# Patient Record
Sex: Male | Born: 1970 | Race: White | Hispanic: No | Marital: Married | Smoking: Current every day smoker
Health system: Southern US, Community
[De-identification: ages and names within clinical notes are randomized; demographics above are authoritative.]

## PROBLEM LIST (undated history)

## (undated) DIAGNOSIS — I499 Cardiac arrhythmia, unspecified: Secondary | ICD-10-CM

## (undated) DIAGNOSIS — R55 Syncope and collapse: Principal | ICD-10-CM

## (undated) HISTORY — PX: ELBOW SURGERY: SHX618

---

## 2004-02-06 ENCOUNTER — Ambulatory Visit (HOSPITAL_COMMUNITY): Admission: RE | Admit: 2004-02-06 | Discharge: 2004-02-06 | Payer: Self-pay | Admitting: Family Medicine

## 2004-09-21 ENCOUNTER — Emergency Department (HOSPITAL_COMMUNITY): Admission: EM | Admit: 2004-09-21 | Discharge: 2004-09-21 | Payer: Self-pay | Admitting: Emergency Medicine

## 2007-06-11 ENCOUNTER — Emergency Department (HOSPITAL_COMMUNITY): Admission: EM | Admit: 2007-06-11 | Discharge: 2007-06-11 | Payer: Self-pay | Admitting: *Deleted

## 2007-06-25 ENCOUNTER — Ambulatory Visit: Admission: RE | Admit: 2007-06-25 | Discharge: 2007-06-25 | Payer: Self-pay | Admitting: Family Medicine

## 2007-07-12 ENCOUNTER — Ambulatory Visit: Payer: Self-pay | Admitting: Pulmonary Disease

## 2008-08-01 ENCOUNTER — Emergency Department (HOSPITAL_COMMUNITY): Admission: EM | Admit: 2008-08-01 | Discharge: 2008-08-02 | Payer: Self-pay | Admitting: Emergency Medicine

## 2010-10-18 LAB — BASIC METABOLIC PANEL
BUN: 10 mg/dL (ref 6–23)
Calcium: 8.6 mg/dL (ref 8.4–10.5)
Creatinine, Ser: 0.74 mg/dL (ref 0.4–1.5)
GFR calc non Af Amer: >60 mL/min (ref >60)
Potassium: 3.2 mEq/L — ABNORMAL LOW (ref 3.5–5.1)

## 2010-10-18 LAB — DIFFERENTIAL
Basophils Absolute: 0.1 10*3/uL (ref 0.0–0.1)
Basophils Relative: 1 % (ref 0–1)
Monocytes Absolute: 0.7 10*3/uL (ref 0.1–1.0)
Monocytes Relative: 7 % (ref 3–12)

## 2010-10-18 LAB — CBC
MCHC: 34.5 g/dL (ref 30.0–36.0)
MCV: 96.3 fL (ref 78.0–100.0)
Platelets: 322 10*3/uL (ref 150–400)
WBC: 10.1 10*3/uL (ref 4.0–10.5)

## 2010-10-18 LAB — POCT CARDIAC MARKERS
CKMB, poc: <1 ng/mL — ABNORMAL LOW (ref 1.0–8.0)
Troponin i, poc: <0.05 ng/mL (ref 0.00–0.09)

## 2010-11-16 NOTE — Procedures (Signed)
Jacob Richardson, FRASIER                   ACCOUNT NO.:  [id number]   MEDICAL RECORD NO.:  [id number]          PATIENT TYPE:  OUT   LOCATION:  SLEEP LAB                     FACILITY:  [facility]   PHYSICIAN:  Barbaraann Share, MD,FCCPDATE OF BIRTH:  02-25-1971   DATE OF STUDY:  06/25/2007                            NOCTURNAL POLYSOMNOGRAM   REFERRING PHYSICIAN:  Angus G. McInnis, MD   INDICATION FOR THE STUDY:  Hypersomnia with sleep apnea.   EPWORTH SLEEPINESS SCORE:  6.   SLEEP ARCHITECTURE:  The patient had a total sleep time of 333 minutes,  with decreased slow-wave sleep for age as well as decreased REM.  Sleep  onset latency was prolonged at 44 minutes, and REM onset was very rapid  at 8.5 minutes.  Sleep efficiency was decreased at 85%.   RESPIRATORY DATA:  The patient was found to have 3 apneas and 4  hypopneas, for an apnea/hypopnea index of only 1.3 events per hour.  The  events were not positional, and loud snoring was noted throughout.  There were not enough events for the patient to meet split-night  criteria.  The patient was also found to have 40 respiratory effort-  related arousals, which gave him a respiratory disturbance index of 9  events per hour.   OXYGEN DATA:  There was O2 desaturation as low as 91% with the patient's  obstructive events.   CARDIAC DATA:  No clinically significant arrhythmias were noted.   MOVEMENT/PARASOMNIA:  The patient was found to have small numbers of leg  jerks, with no significant sleep disruption.   IMPRESSION//RECOMMENDATIONS:  Small numbers of obstructive events which  do not meet the apnea/hypopnea index criteria for the obstructive sleep  apnea syndrome.  There were, however, 40 respiratory effort-related  arousals, and when combined with his apneas and hypopneas, gave him a  respiratory disturbance index of 9 events per hour.  This is clinically  significant sleep disordered breathing and should be treated if the  patient is  symptomatic.  Possible treatments include weight loss alone if  applicable, upper airway surgery, oral appliance, and also CPAP.  Clinical correlation is suggested.      Barbaraann Share, MD,FCCP  Diplomate, American Board of Sleep  Medicine  Electronically Signed     [facility]/MED[facility]  D:  07/12/2007 16:36:20  T:  07/13/2007 08:01:25  Job:  [id number]

## 2011-04-11 LAB — CBC
MCHC: 34
MCV: 94.8
Platelets: 325
RDW: 13.5

## 2011-04-11 LAB — DIFFERENTIAL
Basophils Relative: 1
Lymphocytes Relative: 19
Lymphs Abs: 1.6
Monocytes Absolute: 0.8
Monocytes Relative: 9
Neutrophils Relative %: 70

## 2011-04-11 LAB — BLOOD GAS, ARTERIAL
Acid-base deficit: 3.5 — ABNORMAL HIGH
O2 Saturation: 97.7
TCO2: 16.8
pCO2 arterial: 29.2 — ABNORMAL LOW

## 2011-04-11 LAB — POCT CARDIAC MARKERS
CKMB, poc: <1 — ABNORMAL LOW
Myoglobin, poc: 46.8
Troponin i, poc: <0.05

## 2011-04-11 LAB — URINALYSIS, ROUTINE W REFLEX MICROSCOPIC
Bilirubin Urine: NEGATIVE
Hgb urine dipstick: NEGATIVE
Ketones, ur: 15 — AB
Nitrite: NEGATIVE
pH: 6.5

## 2011-04-11 LAB — RAPID URINE DRUG SCREEN, HOSP PERFORMED
Amphetamines: NOT DETECTED
Cocaine: NOT DETECTED
Tetrahydrocannabinol: POSITIVE — AB

## 2011-04-11 LAB — BASIC METABOLIC PANEL
GFR calc Af Amer: >60
Sodium: 138

## 2011-10-12 ENCOUNTER — Encounter (HOSPITAL_COMMUNITY): Payer: Self-pay | Admitting: Emergency Medicine

## 2011-10-12 ENCOUNTER — Emergency Department (HOSPITAL_COMMUNITY): Payer: Medicaid Other

## 2011-10-12 ENCOUNTER — Emergency Department (HOSPITAL_COMMUNITY)
Admission: EM | Admit: 2011-10-12 | Discharge: 2011-10-12 | Disposition: A | Discharge status: Home / Self Care | Payer: Medicaid Other | Attending: Emergency Medicine | Admitting: Emergency Medicine

## 2011-10-12 DIAGNOSIS — F17200 Nicotine dependence, unspecified, uncomplicated: Secondary | ICD-10-CM | POA: Insufficient documentation

## 2011-10-12 DIAGNOSIS — M79609 Pain in unspecified limb: Secondary | ICD-10-CM | POA: Insufficient documentation

## 2011-10-12 DIAGNOSIS — M7989 Other specified soft tissue disorders: Secondary | ICD-10-CM | POA: Insufficient documentation

## 2011-10-12 DIAGNOSIS — M79643 Pain in unspecified hand: Secondary | ICD-10-CM

## 2011-10-12 MED ORDER — OXYCODONE-ACETAMINOPHEN 5-325 MG PO TABS
1.0000 | ORAL_TABLET | Freq: Once | ORAL | Status: AC
  Administered 2011-10-12: 1 via ORAL
  Filled 2011-10-12: qty 1

## 2011-10-12 MED ORDER — OXYCODONE-ACETAMINOPHEN 5-325 MG PO TABS: 1.0000 | ORAL_TABLET | Freq: Four times a day (QID) | ORAL | Status: AC | PRN

## 2011-10-12 NOTE — ED Notes (Signed)
PT. REPORTS LEFT HAND PAIN FOR 3 MONTHS , DENIES INJURY.

## 2011-10-12 NOTE — ED Provider Notes (Signed)
History     CSN: [id number]  Arrival date & time 10/12/11  1478   First MD Initiated Contact with Patient 10/12/11 0244      Chief Complaint  Patient presents with  . Hand Pain    (Consider location/radiation/quality/duration/timing/severity/associated sxs/prior treatment) Patient is a 41 y.o. male presenting with hand pain. The history is provided by the patient.  Hand Pain This is a recurrent problem.   patient has pain in his left hand for last 3 months. It comes and goes. He states he gets swelling that also comes and goes on his hands. He states the swelling comes on the veins will get bigger. Points to the palm was hand as to where the pain is. He states he also has had swelling of his forearm. No trauma. 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 No numbness. No weakness, but the strength is limited by pain  History reviewed. No pertinent past medical history.  History reviewed. No pertinent past surgical history.  No family history on file.  History  Substance Use Topics  . Smoking status: Current Everyday Smoker  . Smokeless tobacco: Not on file  . Alcohol Use: No      Review of Systems  Musculoskeletal: Negative for joint swelling.  Neurological: Negative for weakness and numbness.    Allergies  Codeine  Home Medications   Current Outpatient Rx  Name Route Sig Dispense Refill  . OXYCODONE-ACETAMINOPHEN 5-325 MG PO TABS Oral Take 1-2 tablets by mouth every 6 (six) hours as needed for pain. 10 tablet 0    BP 132/75  Pulse 76  Temp(Src) 97.6 F (36.4 C) (Oral)  Resp 20  SpO2 97%  Physical Exam  Constitutional: He appears well-developed and well-nourished.  Musculoskeletal:       Pain with movement of left thumb. On the dorsal aspect of the hand there is approximate 1 x 2 cm swelling just medial to the first metacarpal. It is firm. It is rather fixed. It is nontender. Sensation is intact distally. He does not go all the way to the wrist.    ED Course    Procedures (including critical care time)  Labs Reviewed - No data to display Dg Hand Complete Left  10/12/2011  *RADIOLOGY REPORT*  Clinical Data: Left hand pain and knot; pain just proximal to the metacarpophalangeal joints, radiating up the arm.  LEFT HAND - COMPLETE 3+ VIEW  Comparison: None.  Findings: There is no evidence of fracture or dislocation.  The joint spaces are preserved; the soft tissues are unremarkable in appearance.  The carpal rows are intact, and demonstrate normal alignment.  No radiopaque foreign bodies are seen.  IMPRESSION: No evidence of fracture or dislocation.  No radiopaque foreign bodies seen.  Original Report Authenticated By: Tonia Ghent, M.D.     1. Hand pain       MDM  Right hand pain for last 2 months. No injury. He does have a swelling to the dorsum of his hand, but the mass is nontender. Does not appear to be infected. X-rays negative. He'll followup with Dr. Amanda Pea.        Juliet Rude. Rubin Payor, MD 10/12/11 [id number]

## 2011-10-12 NOTE — ED Notes (Signed)
Rx x 1, pt voiced understanding to f/u with Dr. Amanda Pea

## 2011-10-12 NOTE — Discharge Instructions (Signed)
Follow up with Dr. Gramig  

## 2011-11-07 ENCOUNTER — Other Ambulatory Visit (HOSPITAL_COMMUNITY): Payer: Self-pay | Admitting: Orthopedic Surgery

## 2011-11-07 DIAGNOSIS — M25539 Pain in unspecified wrist: Secondary | ICD-10-CM

## 2011-11-09 ENCOUNTER — Other Ambulatory Visit (HOSPITAL_COMMUNITY): Payer: Self-pay

## 2012-07-01 ENCOUNTER — Encounter (HOSPITAL_COMMUNITY): Payer: Self-pay | Admitting: *Deleted

## 2012-07-01 ENCOUNTER — Emergency Department (HOSPITAL_COMMUNITY)
Admission: EM | Admit: 2012-07-01 | Discharge: 2012-07-02 | Disposition: A | Discharge status: Home / Self Care | Payer: Self-pay | Attending: Emergency Medicine | Admitting: Emergency Medicine

## 2012-07-01 ENCOUNTER — Emergency Department (HOSPITAL_COMMUNITY): Payer: Self-pay

## 2012-07-01 DIAGNOSIS — R509 Fever, unspecified: Secondary | ICD-10-CM | POA: Insufficient documentation

## 2012-07-01 DIAGNOSIS — R197 Diarrhea, unspecified: Secondary | ICD-10-CM | POA: Insufficient documentation

## 2012-07-01 DIAGNOSIS — R112 Nausea with vomiting, unspecified: Secondary | ICD-10-CM | POA: Insufficient documentation

## 2012-07-01 DIAGNOSIS — R05 Cough: Secondary | ICD-10-CM | POA: Insufficient documentation

## 2012-07-01 DIAGNOSIS — R109 Unspecified abdominal pain: Secondary | ICD-10-CM | POA: Insufficient documentation

## 2012-07-01 DIAGNOSIS — R059 Cough, unspecified: Secondary | ICD-10-CM | POA: Insufficient documentation

## 2012-07-01 LAB — COMPREHENSIVE METABOLIC PANEL
ALT: 23 U/L (ref 0–53)
Albumin: 3.7 g/dL (ref 3.5–5.2)
GFR calc Af Amer: >90 mL/min (ref >90)
GFR calc non Af Amer: >90 mL/min (ref >90)
Potassium: 3.4 mEq/L — ABNORMAL LOW (ref 3.5–5.1)
Total Bilirubin: 0.3 mg/dL (ref 0.3–1.2)
Total Protein: 7.2 g/dL (ref 6.0–8.3)

## 2012-07-01 LAB — URINALYSIS, ROUTINE W REFLEX MICROSCOPIC
Ketones, ur: 15 mg/dL — AB
Leukocytes, UA: NEGATIVE
Specific Gravity, Urine: 1.026 (ref 1.005–1.030)
Urobilinogen, UA: 1 mg/dL (ref 0.0–1.0)
pH: 5.5 (ref 5.0–8.0)

## 2012-07-01 LAB — URINE MICROSCOPIC-ADD ON

## 2012-07-01 LAB — CBC WITH DIFFERENTIAL/PLATELET
HCT: 45.1 % (ref 39.0–52.0)
Hemoglobin: 15.4 g/dL (ref 13.0–17.0)
Lymphocytes Relative: 12 % (ref 12–46)
Lymphs Abs: 1.2 10*3/uL (ref 0.7–4.0)
MCHC: 34.1 g/dL (ref 30.0–36.0)
Neutro Abs: 8.2 10*3/uL — ABNORMAL HIGH (ref 1.7–7.7)
Platelets: 281 10*3/uL (ref 150–400)
RBC: 4.88 MIL/uL (ref 4.22–5.81)

## 2012-07-01 MED ORDER — SODIUM CHLORIDE 0.9 % IV BOLUS (SEPSIS)
1000.0000 mL | Freq: Once | INTRAVENOUS | Status: AC
  Administered 2012-07-01: 1000 mL via INTRAVENOUS

## 2012-07-01 MED ORDER — KETOROLAC TROMETHAMINE 30 MG/ML IJ SOLN
30.0000 mg | Freq: Once | INTRAMUSCULAR | Status: AC
  Administered 2012-07-01: 30 mg via INTRAVENOUS
  Filled 2012-07-01: qty 1

## 2012-07-01 MED ORDER — ONDANSETRON HCL 4 MG/2ML IJ SOLN
4.0000 mg | Freq: Once | INTRAMUSCULAR | Status: AC
Start: 2012-07-01 — End: 2012-07-01
  Administered 2012-07-01: 4 mg via INTRAVENOUS
  Filled 2012-07-01: qty 2

## 2012-07-01 MED ORDER — POTASSIUM CHLORIDE 20 MEQ/15ML (10%) PO LIQD
20.0000 meq | Freq: Once | ORAL | Status: AC
  Administered 2012-07-01: 20 meq via ORAL
  Filled 2012-07-01: qty 15

## 2012-07-01 NOTE — ED Notes (Signed)
Chest congestion with a cough also

## 2012-07-01 NOTE — ED Notes (Signed)
The pt is c/o a headache abd pain vomiting with a temp since Friday.  He is currently hyperventilating.  He reports his grandson was just diagnosed with pneumonia

## 2012-07-02 MED ORDER — OSELTAMIVIR PHOSPHATE 75 MG PO CAPS: 75.0000 mg | ORAL_CAPSULE | Freq: Two times a day (BID) | ORAL | Status: DC

## 2012-07-02 MED ORDER — ONDANSETRON HCL 4 MG PO TABS: 4.0000 mg | ORAL_TABLET | Freq: Four times a day (QID) | ORAL | Status: DC

## 2012-07-02 NOTE — ED Provider Notes (Signed)
History     CSN: [id number]  Arrival date & time 07/01/12  2126   First MD Initiated Contact with Patient 07/01/12 2308      Chief Complaint  Patient presents with  . multiple complaints     (Consider location/radiation/quality/duration/timing/severity/associated sxs/prior treatment) HPI History provided by patient. Sick the last few days with nausea vomiting and diarrhea and now has developed fever with headache and generalized body aches. Symptoms moderate severity. Unable to hold anything down today. Grandson at home sick with similar symptoms. Otherwise no known contacts. No blood in emesis or stools. No recent travel. No recent antibiotics. Is otherwise healthy without medical problems.  yHistory reviewed. No pertinent past medical history.  History reviewed. No pertinent past surgical history.  No family history on file.  History  Substance Use Topics  . Smoking status: Current Every Day Smoker  . Smokeless tobacco: Not on file  . Alcohol Use: No      Review of Systems  Constitutional: Positive for fever and chills.  HENT: Negative for neck pain and neck stiffness.   Eyes: Negative for pain.  Respiratory: Positive for cough. Negative for shortness of breath.   Cardiovascular: Negative for chest pain.  Gastrointestinal: Positive for nausea, vomiting and diarrhea. Negative for abdominal pain and blood in stool.  Genitourinary: Negative for dysuria.  Musculoskeletal: Negative for back pain.  Skin: Negative for rash.  Neurological: Negative for seizures and syncope.  All other systems reviewed and are negative.    Allergies  Codeine  Home Medications   Current Outpatient Rx  Name  Route  Sig  Dispense  Refill  . OSELTAMIVIR PHOSPHATE 75 MG PO CAPS   Oral   Take 75 mg by mouth once.         Marland Kitchen ONDANSETRON HCL 4 MG PO TABS   Oral   Take 1 tablet (4 mg total) by mouth every 6 (six) hours.   12 tablet   0   . OSELTAMIVIR PHOSPHATE 75 MG PO CAPS    Oral   Take 1 capsule (75 mg total) by mouth every 12 (twelve) hours.   10 capsule   0     BP 122/52  Pulse 64  Temp 98.5 F (36.9 C) (Oral)  Resp 24  SpO2 95%  Physical Exam  Constitutional: He is oriented to person, place, and time. He appears well-developed and well-nourished.  HENT:  Head: Normocephalic and atraumatic.       Dry mucous membranes  Eyes: EOM are normal. Pupils are equal, round, and reactive to light. No scleral icterus.  Neck: Neck supple.  Cardiovascular: Regular rhythm and intact distal pulses.        Mild tachycardia  Pulmonary/Chest: Effort normal and breath sounds normal. No stridor. No respiratory distress. He has no wheezes. He has no rales. He exhibits no tenderness.  Abdominal: Soft. Bowel sounds are normal. He exhibits no distension. There is no tenderness. There is no rebound and no guarding.  Musculoskeletal: Normal range of motion. He exhibits no edema.  Lymphadenopathy:    He has no cervical adenopathy.  Neurological: He is alert and oriented to person, place, and time.  Skin: Skin is warm and dry.    ED Course  Procedures (including critical care time)  Labs Reviewed  CBC WITH DIFFERENTIAL - Abnormal; Notable for the following:    Neutrophils Relative 79 (*)     Neutro Abs 8.2 (*)     All other components within normal limits  COMPREHENSIVE  METABOLIC PANEL - Abnormal; Notable for the following:    Sodium 129 (*)     Potassium 3.4 (*)     Chloride 93 (*)     Glucose, Bld 108 (*)     All other components within normal limits  URINALYSIS, ROUTINE W REFLEX MICROSCOPIC - Abnormal; Notable for the following:    Color, Urine AMBER (*)  BIOCHEMICALS MAY BE AFFECTED BY COLOR   APPearance CLOUDY (*)     Hgb urine dipstick TRACE (*)     Bilirubin Urine SMALL (*)     Ketones, ur 15 (*)     All other components within normal limits  URINE MICROSCOPIC-ADD ON - Abnormal; Notable for the following:    Bacteria, UA FEW (*)     All other  components within normal limits   Dg Chest 2 View  07/01/2012  *RADIOLOGY REPORT*  Clinical Data: Chest congestion, cough, fever, shortness of breath, nausea and vomiting.  CHEST - 2 VIEW  Comparison: Chest radiograph performed 08/01/2008  Findings: The lungs are well-aerated and clear.  There is no evidence of focal opacification, pleural effusion or pneumothorax.  The heart is normal in size; the mediastinal contour is within normal limits.  No acute osseous abnormalities are seen.  IMPRESSION: No acute cardiopulmonary process seen.   Original Report Authenticated By: Tonia Ghent, M.D.      1. Nausea vomiting and diarrhea    IV fluids. IV Zofran. IV Toradol.  Patient given 2 L of IV fluids and on recheck is feeling much better. He is requesting to be discharged home. Plan Tamiflu and Zofran. Strict return precautions verbalized is understood.  MDM  Fever, cough with nausea vomiting diarrhea. Improved with medications as above. Imaging and labs reviewed. Vital signs and nursing notes reviewed. Condition improved in the ED and plan outpatient management. Prescriptions provided.        Sunnie Nielsen, MD 07/02/12 [phone]

## 2012-12-30 ENCOUNTER — Emergency Department (HOSPITAL_COMMUNITY): Payer: Self-pay

## 2012-12-30 ENCOUNTER — Inpatient Hospital Stay (HOSPITAL_COMMUNITY)
Admission: EM | Admit: 2012-12-30 | Discharge: 2013-01-01 | DRG: 312 | Disposition: A | Discharge status: Home / Self Care | Payer: Self-pay | Attending: Internal Medicine | Admitting: Internal Medicine

## 2012-12-30 ENCOUNTER — Encounter (HOSPITAL_COMMUNITY): Payer: Self-pay

## 2012-12-30 DIAGNOSIS — I252 Old myocardial infarction: Secondary | ICD-10-CM

## 2012-12-30 DIAGNOSIS — I499 Cardiac arrhythmia, unspecified: Secondary | ICD-10-CM

## 2012-12-30 DIAGNOSIS — R55 Syncope and collapse: Principal | ICD-10-CM

## 2012-12-30 DIAGNOSIS — R9431 Abnormal electrocardiogram [ECG] [EKG]: Secondary | ICD-10-CM

## 2012-12-30 DIAGNOSIS — I498 Other specified cardiac arrhythmias: Secondary | ICD-10-CM | POA: Diagnosis present

## 2012-12-30 DIAGNOSIS — F17200 Nicotine dependence, unspecified, uncomplicated: Secondary | ICD-10-CM

## 2012-12-30 DIAGNOSIS — S0093XA Contusion of unspecified part of head, initial encounter: Secondary | ICD-10-CM

## 2012-12-30 DIAGNOSIS — R001 Bradycardia, unspecified: Secondary | ICD-10-CM

## 2012-12-30 HISTORY — DX: Cardiac arrhythmia, unspecified: I49.9

## 2012-12-30 HISTORY — DX: Syncope and collapse: R55

## 2012-12-30 LAB — COMPREHENSIVE METABOLIC PANEL
ALT: 9 U/L (ref 0–53)
AST: 10 U/L (ref 0–37)
Albumin: 3 g/dL — ABNORMAL LOW (ref 3.5–5.2)
Alkaline Phosphatase: 62 U/L (ref 39–117)
BUN: 9 mg/dL (ref 6–23)
CO2: 25 mEq/L (ref 19–32)
Calcium: 8.2 mg/dL — ABNORMAL LOW (ref 8.4–10.5)
Chloride: 104 mEq/L (ref 96–112)
Creatinine, Ser: 0.84 mg/dL (ref 0.50–1.35)
GFR calc non Af Amer: >90 mL/min (ref >90)
Potassium: 4.1 mEq/L (ref 3.5–5.1)
Total Bilirubin: 0.4 mg/dL (ref 0.3–1.2)
Total Protein: 5.4 g/dL — ABNORMAL LOW (ref 6.0–8.3)

## 2012-12-30 LAB — CBC WITH DIFFERENTIAL/PLATELET
Basophils Absolute: 0.1 10*3/uL (ref 0.0–0.1)
Basophils Absolute: 0.1 10*3/uL (ref 0.0–0.1)
Eosinophils Relative: 3 % (ref 0–5)
HCT: 41.9 % (ref 39.0–52.0)
Hemoglobin: 14.2 g/dL (ref 13.0–17.0)
Lymphocytes Relative: 24 % (ref 12–46)
Lymphocytes Relative: 38 % (ref 12–46)
Monocytes Absolute: 0.6 10*3/uL (ref 0.1–1.0)
Neutro Abs: 6.7 10*3/uL (ref 1.7–7.7)
Neutro Abs: 5 10*3/uL (ref 1.7–7.7)
Platelets: 314 10*3/uL (ref 150–400)
RBC: 4.49 MIL/uL (ref 4.22–5.81)
RDW: 13.2 % (ref 11.5–15.5)
RDW: 13.3 % (ref 11.5–15.5)
WBC: 10.2 10*3/uL (ref 4.0–10.5)
WBC: 10 10*3/uL (ref 4.0–10.5)

## 2012-12-30 LAB — URINALYSIS, ROUTINE W REFLEX MICROSCOPIC
Bilirubin Urine: NEGATIVE
Glucose, UA: NEGATIVE mg/dL
Hgb urine dipstick: NEGATIVE
Ketones, ur: NEGATIVE mg/dL
Protein, ur: NEGATIVE mg/dL

## 2012-12-30 LAB — PRO B NATRIURETIC PEPTIDE: Pro B Natriuretic peptide (BNP): 72.7 pg/mL (ref 0–125)

## 2012-12-30 LAB — MAGNESIUM: Magnesium: 1.9 mg/dL (ref 1.5–2.5)

## 2012-12-30 MED ORDER — ACETAMINOPHEN 325 MG PO TABS: 650.0000 mg | ORAL_TABLET | ORAL | Status: DC | PRN

## 2012-12-30 MED ORDER — SODIUM CHLORIDE 0.9 % IV BOLUS (SEPSIS)
1000.0000 mL | Freq: Once | INTRAVENOUS | Status: AC
  Administered 2012-12-30: 1000 mL via INTRAVENOUS

## 2012-12-30 MED ORDER — ENOXAPARIN SODIUM 40 MG/0.4ML ~~LOC~~ SOLN
40.0000 mg | SUBCUTANEOUS | Status: DC
  Filled 2012-12-30 (×3): qty 0.4

## 2012-12-30 MED ORDER — MORPHINE SULFATE 4 MG/ML IJ SOLN
4.0000 mg | Freq: Once | INTRAMUSCULAR | Status: AC
  Administered 2012-12-30: 4 mg via INTRAVENOUS
  Filled 2012-12-30: qty 1

## 2012-12-30 MED ORDER — ASPIRIN 300 MG RE SUPP
300.0000 mg | RECTAL | Status: AC
  Filled 2012-12-30: qty 1

## 2012-12-30 MED ORDER — NITROGLYCERIN 0.4 MG SL SUBL: 0.4000 mg | SUBLINGUAL_TABLET | SUBLINGUAL | Status: DC | PRN

## 2012-12-30 MED ORDER — ONDANSETRON HCL 4 MG/2ML IJ SOLN
4.0000 mg | Freq: Once | INTRAMUSCULAR | Status: AC
  Administered 2012-12-30: 4 mg via INTRAVENOUS
  Filled 2012-12-30: qty 2

## 2012-12-30 MED ORDER — SODIUM CHLORIDE 0.9 % IV SOLN
INTRAVENOUS | Status: AC
  Administered 2012-12-30: 16:00:00 via INTRAVENOUS

## 2012-12-30 MED ORDER — ASPIRIN 81 MG PO CHEW
324.0000 mg | CHEWABLE_TABLET | ORAL | Status: AC
  Administered 2012-12-30: 324 mg via ORAL
  Filled 2012-12-30: qty 4

## 2012-12-30 MED ORDER — ONDANSETRON HCL 4 MG/2ML IJ SOLN: 4.0000 mg | Freq: Four times a day (QID) | INTRAMUSCULAR | Status: DC | PRN

## 2012-12-30 MED ORDER — HYDROMORPHONE HCL PF 1 MG/ML IJ SOLN: 1.0000 mg | INTRAMUSCULAR | Status: AC | PRN

## 2012-12-30 MED ORDER — ONDANSETRON HCL 4 MG/2ML IJ SOLN: 4.0000 mg | Freq: Three times a day (TID) | INTRAMUSCULAR | Status: DC | PRN

## 2012-12-30 NOTE — ED Provider Notes (Signed)
History    CSN: [id number] Arrival date & time 12/30/12  1240  First MD Initiated Contact with Patient 12/30/12 1246     No chief complaint on file.  (Consider location/radiation/quality/duration/timing/severity/associated sxs/prior Treatment) HPI  Jacob Richardson Is a 42 year old male current every day smoker with no significant past medical history.  The patient presents today with chief complaint of sudden onset nausea vomiting, syncopal episode and headache.  Yesterday the patient began complaining of sharp left arm pain.  This lasted for several hours through the night.  The patient also had associated chest pain and shortness of breath.  The patient states that the chest pain resloved over an hour. H had no associated n/v/ or diaphoresis.  This momning th patient was sittnig at the table drinking coffee when he became abruptly nauseated and ran to the bathroom to vomit. He passed out and hit his head on the toilet seat. Patient was unconscious for  Never vomited. Wife states that he was ashen and shaky. He awoke with a severe headache with associated photophobia and phonophobia.Denies unilateral weakness, facial asymmetry, difficulty with speech, change in gait,visual disturbance or vertigo. He has no history of headaches or migraine. The patient does have a a 44 ear old son who had an scute MI last year due to coronary artery disease.  His mother had MI/Stroke after 50.  Denies fevers, chills, myalgias, arthralgias. Denies DOE, SOB, chest tightness or pressure, radiation to left arm, jaw or back, or diaphoresis. Denies dysuria, flank pain, suprapubic pain, frequency, urgency, or hematuria. Denies headaches, light headedness, weakness, visual disturbances. Denies abdominal pain, nausea, vomiting, diarrhea or constipation.    No past medical history on file. No past surgical history on file. No family history on file. History  Substance Use Topics  . Smoking status: Current Every  Day Smoker  . Smokeless tobacco: Not on file  . Alcohol Use: No    Review of Systems Ten systems reviewed and are negative for acute change, except as noted in the HPI.   Allergies  Codeine  Home Medications  No current outpatient prescriptions on file. BP 123/69  Temp(Src) 98.3 F (36.8 C) (Oral)  Resp 20  Ht 5\' 7"  (1.702 m)  Wt 120 lb (54.432 kg)  BMI 18.79 kg/m2  SpO2 96% Physical Exam Physical Exam  Nursing note and vitals reviewed. Constitutional: He appears well-developed and well-nourished. No distress.  HENT:  Head: Normocephalic and atraumatic.  Eyes: Conjunctivae normal are normal. No scleral icterus.  Neck: Normal range of motion. Neck supple.  Cardiovascular: Normal rate, regular rhythm and normal heart sounds.   Pulmonary/Chest: Effort normal and breath sounds normal. No respiratory distress.  Abdominal: Soft. There is no tenderness.  Musculoskeletal: He exhibits no edema.  Neurological: He is alert. photophobic. EOMI. Speech is clear and goal oriented, follows commands Major Cranial nerves without deficit, no facial droop Normal strength in upper and lower extremities bilaterally including dorsiflexion and plantar flexion, strong and equal grip strength Sensation normal to light and sharp touch Moves extremities without ataxia, coordination intact Normal finger to nose and rapid alternating movements Skin: Skin is warm and dry. He is not diaphoretic.  Psychiatric: His behavior is normal.     ED Course  Procedures (including critical care time) Labs Reviewed  COMPREHENSIVE METABOLIC PANEL - Abnormal; Notable for the following:    Total Protein 5.9 (*)    Albumin 3.1 (*)    All other components within normal limits  CBC WITH DIFFERENTIAL  LIPASE, BLOOD  URINALYSIS, ROUTINE W REFLEX MICROSCOPIC   No results found. 1. Syncope   2. EKG, abnormal   3. Bradycardia   4. Traumatic hematoma of head, initial encounter     Date: 12/30/2012  Rate: 44   Rhythm: sinus bradycardia  QRS Axis: normal  Intervals: normal  ST/T Wave abnormalities: T wave inversion  aVL, V1, V2  Conduction Disutrbances:none  Narrative Interpretation:   Old EKG Reviewed: none available   MDM  3:08 PM Patient with concerning history for ACS.  No focal abnormalities on neuro. ekg  Shows signs of ischemia. Troponin is negative..  Ct show cystic lesion in the brain that is likely old and not likely related to today's headache which I believe is secondary to the head trauma. I have spoken with Dr. Joseph Art who will admit the patient for syncope work up and ACS rule out.   The patient appears reasonably stabilized for admission considering the current resources, flow, and capabilities available in the ED at this time, and I doubt any other [facility] requiring further screening and/or treatment in the ED prior to admission.   Arthor Captain, PA-C 12/30/12 1624

## 2012-12-30 NOTE — Consult Note (Signed)
Cardiology Consult Note  Admit date: 12/30/2012 Name: Jacob Richardson 42 y.o.  male DOB:  1971/02/22 MRN:  [id number]  Today's date:  12/30/2012  Referring Physician:    Dr. Carolyne Littles  Primary Physician:   Dr. Foster Simpson  Reason for Consultation:  Syncope, arm pain  IMPRESSIONS: 1. Syncopal episode that is probable vasovagal in origin 2. Left arm pain 3. Tobacco abuse with cigarettes  RECOMMENDATION: 1. Obtain echocardiogram 2. Telemetry monitoring overnight 3. Repeat EKG and serial cardiac enzymes 4. If EKG remains normal and enzymes were normal would do a regular treadmill test in the morning 5. Tobacco smoking cessation 6. Check orthostatic blood pressures 7. Check drug screen  HISTORY: This 43 year old male is been healthy except for smoking. 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 He worked yesterday and was feeling relatively well. Last evening he had sharp left elbow pain and had some sharp chest pain. He awoke this morning and was somewhat nauseated and went to the bathroom to vomit and evidently while in the bathroom had a syncopal episode. EMS was called and they evidently had to break the door down to get into the bathroom. He had no recollection of the event except remembers EMS coming in. He denied any feeling of hotness or sweating prior to that the his wife stated that he was ashen and pale. He was transported here to the emergency room has a negative EKG and has initial negative enzymes. A CT scan was unremarkable. He denies recent drug use although there is a reported history of marijuana use in the past. His son evidently had a myocardial infarction at age 56 also has been treated for arrhythmias by a physician at [facility]  History reviewed. No pertinent past medical history.    Past Surgical History  Procedure Laterality Date  . Elbow surgery       Allergies:  is allergic to codeine.   Medications: Prior to Admission medications   Not  on File    Family History: Family Status  Relation Status Death Age  . Father Alive     Alzheimer's  . Mother Alive     COPD  . Brother Alive   . Brother Alive   . Brother Alive   . Brother Alive   . Sister Alive   . Sister Alive   . Son Alive     history of MI and arrythmia    Social History:   reports that he has been smoking Cigarettes.  He has a 25 pack-year smoking history. He has never used smokeless tobacco. He reports that he does not drink alcohol or use illicit drugs.   History   Social History Narrative  . No narrative on file    Review of Systems:  Normally no iron is or throat complaints. He can do normal work without shortness of breath or exertional symptoms. No significant arthritis or problems with his urinary system. No GI symptoms or problems normally.  Other than as noted above the remainder of the review of systems is unremarkable.  Physical Exam: BP 134/82  Pulse 54  Temp(Src) 98.3 F (36.8 C) (Oral)  Resp 18  Ht 5\' 7"  (1.702 m)  Wt 56.291 kg (124 lb 1.6 oz)  BMI 19.43 kg/m2  SpO2 99%   General appearance: alert, cooperative, appears stated age and no distress Head: Normocephalic, without obvious abnormality, atraumatic Eyes: conjunctivae/corneas clear. PERRL, EOM's intact. Fundi not examined Neck: no adenopathy, no carotid bruit,  no JVD and supple, symmetrical, trachea midline Lungs: clear to auscultation bilaterally Heart: regular rate and rhythm, S1, S2 normal, no murmur, click, rub or gallop Abdomen: soft, non-tender; bowel sounds normal; no masses,  no organomegaly Rectal: deferred Extremities: extremities normal, atraumatic, no cyanosis or edema Pulses: 2+ and symmetric Skin: Skin color, texture, turgor normal. No rashes or lesions Neurologic: Grossly normal  Labs: CBC  Recent Labs  12/30/12 2006  WBC 10.0  RBC 4.27  HGB 13.6  HCT 40.0  PLT 314  MCV 93.7  MCH 31.9  MCHC 34.0  RDW 13.3  LYMPHSABS 3.8  MONOABS 0.8   EOSABS 0.3  BASOSABS 0.1   CMP   Recent Labs  12/30/12 2006  NA 137  K 4.1  CL 104  CO2 26  GLUCOSE 84  BUN 9  CREATININE 0.91  CALCIUM 8.2*  PROT 5.4*  ALBUMIN 3.0*  AST 9  ALT 7  ALKPHOS 62  BILITOT 0.4  GFRNONAA >90  GFRAA >90   BNP (last 3 results)  Recent Labs  12/30/12 2005  PROBNP 72.7   Cardiac Panel (last 3 results)  Recent Labs  12/30/12 1905  TROPONINI <0.30     Radiology: Chest x-ray not done, CT scan of head says no obvious trauma but question of a basal ganglion cyst  EKG: Sinus bradycardia otherwise normal  Signed:  W. Ashley Royalty MD [facility]   Cardiology Consultant  12/30/2012, 10:41 PM

## 2012-12-30 NOTE — H&P (Signed)
[facility] History and Physical  HENOK HEACOCK ZOX:[id number] DOB: 04/10/1971 DOA: 12/30/2012  Referring physician:   PCP: Pcp Not In System  Specialists: Cardiology Chief Complaint: Syncope, head trauma  HPI: Jacob Richardson is a 42 y.o. WM no PMHx state positive left arm pain, positive substernal chest pain, negative shortness of breath, negative nausea/vomiting which occurred Saturday night. Patient did not seek treatment and symptoms spontaneously resolved. TODAY positive nausea went to go to the bathroom and that the last thing patient remembers until the EMTs were walking him out of his house to their unit. Per patient's son patient fell struck his head on the toilet, and patient's son had to break the door in order to attend to the patient. No evidence of acute injury. Negative orthostatic blood pressure. One set of cardiac enzymes negative HEAD CT shows; Small cyst below the left basal ganglia, This likely represents a dilated perivascular space or correlate plexus cyst.   Outpatient MRI can be performed to further characterize if symptoms  correlate with the abnormality.     Review of Systems: The patient denies anorexia, fever, weight loss,, vision loss, decreased hearing, hoarseness, c dyspnea on exertion, peripheral edema, balance deficits, hemoptysis, abdominal pain, melena, hematochezia, severe indigestion/heartburn, hematuria, incontinence, genital sores, muscle weakness, suspicious skin lesions, transient blindness, difficulty walking, depression, unusual weight change, abnormal bleeding, enlarged lymph nodes, angioedema, and breast masses.   Positive;hest pain, syncope,   History reviewed. No pertinent past medical history. History reviewed. No pertinent past surgical history. Social History:  One pack per day x26 years, negative alcohol, plus marijuana.   where does patient live--home, lives at home with family Can patient participate in ADLs? Yes  Allergies  Allergen  Reactions  . Codeine Nausea And Vomiting and Other (See Comments)    Passes out    No family history on file.   Prior to Admission medications   Not on File   Physical Exam: Filed Vitals:   12/30/12 1300 12/30/12 1430 12/30/12 1810 12/30/12 1859  BP: 108/69 129/65 109/65 128/62  Pulse: 46 47 45 47  Temp:    98.3 F (36.8 C)  TempSrc:      Resp: 18 23 18 18   Height:    5\' 7"  (1.702 m)  Weight:    56.291 kg (124 lb 1.6 oz)  SpO2: 95% 95% 96% 99%     General:  Alert and oriented x4,NAD  Eyes: Pupils equal round reactive to light and accommodation  Cardiovascular: Regular rhythm, bradycardic rate, negative murmurs rubs or gallops, PT/DP pulse +2  Respiratory: Clear to auscultation bilaterally  Abdomen: Soft, nontender, nondistended, plus bowel sounds   Neurologic: Alert and oriented x4, pupils equal round reactive to light and accommodation, cranial nerves II through XII intact, sensation intact throughout, motor strength 5/5 in all extremities. Did not ambulate secondary to bradycardia and acute history syncope.  Labs on Admission:  Basic Metabolic Panel:  Recent Labs Lab 12/30/12 1321  NA 136  K 4.3  CL 104  CO2 25  GLUCOSE 89  BUN 9  CREATININE 0.84  CALCIUM 8.5   Liver Function Tests:  Recent Labs Lab 12/30/12 1321  AST 10  ALT 9  ALKPHOS 68  BILITOT 0.4  PROT 5.9*  ALBUMIN 3.1*    Recent Labs Lab 12/30/12 1321  LIPASE 24   No results found for this basename: AMMONIA,  in the last 168 hours CBC:  Recent Labs Lab 12/30/12 1321  WBC 10.2  NEUTROABS 6.7  HGB 14.2  HCT 41.9  MCV 93.3  PLT 333   Cardiac Enzymes: No results found for this basename: CKTOTAL, CKMB, CKMBINDEX, TROPONINI,  in the last 168 hours  BNP (last 3 results) No results found for this basename: PROBNP,  in the last 8760 hours CBG: No results found for this basename: GLUCAP,  in the last 168 hours  Radiological Exams on Admission: Ct Head Wo Contrast  12/30/2012    *RADIOLOGY REPORT*  Clinical Data: Severe headache  CT HEAD WITHOUT CONTRAST  Technique:  Contiguous axial images were obtained from the base of the skull through the vertex without contrast.  Comparison: None.  Findings: Benign-appearing small cyst below the left basal ganglia as likely a cord plexus cyst or dilated perivascular space.  There is no midline shift or acute intracranial hemorrhage. Ventricles system is within normal limits.  Cranium is intact.  Visualized paranasal sinuses and mastoid air cells are clear.  IMPRESSION: No evidence of acute injury.  Small cyst below the left basal ganglia as described.  This likely represents a dilated  perivascular space or correlate plexus cyst. Outpatient MRI can be performed to further characterize if symptoms correlate with the abnormality.   Original Report Authenticated By: Jolaine Click, M.D.    EKG: No previous EKG for comparison sinus bradycardia, (45 BPM), otherwise normal EKG.   Assessment/Plan 1.  syncope with head trauma. Obtain CMP to rule out any electrolyte imbalance, obtained thyroid panel to rule out hypothyroidism, obtain cardiac enzymes rule out MI, obtain a drug screen to rule out side effects of illicit drugs, obtain 2-D cardiac echo, consult cardiology to rule out sick sinus syndrome. This time we'll off on any beta blocker, ACE inhibitor secondary to patient's bradycardia and normal BP. Although at the lower end of the differential will obtain a brain MRI rule out CVA. NOTE; spoke with Dr. Donnie Aho (cardiologist) and he has agreed to see patient in.      Code Status:Full  Family Communication: Plan communicated to son and patient Disposition Plan:? Time spent: 60 minutes  Drema Dallas [facility] Pager [phone]  If 7PM-7AM, please contact night-coverage [email or web address] Password [facility] 12/30/2012, 7:25 PM

## 2012-12-30 NOTE — ED Notes (Signed)
Pt from home.  Pt c/o headache with photosensitivity and dizziness.  Per EMS, pt's family reports pt has same episodes every couple of months and no cause has been found.  Pt not diagnosed with migraines.  Pt states he had syncopal episodes x 2, but both were unwitnessed.

## 2012-12-31 ENCOUNTER — Inpatient Hospital Stay (HOSPITAL_COMMUNITY): Payer: Self-pay

## 2012-12-31 ENCOUNTER — Encounter (HOSPITAL_COMMUNITY): Payer: Self-pay | Admitting: General Practice

## 2012-12-31 DIAGNOSIS — F17200 Nicotine dependence, unspecified, uncomplicated: Secondary | ICD-10-CM | POA: Diagnosis present

## 2012-12-31 DIAGNOSIS — R55 Syncope and collapse: Secondary | ICD-10-CM | POA: Diagnosis present

## 2012-12-31 LAB — HEMOGLOBIN A1C
Hgb A1c MFr Bld: 5.5 % (ref <5.7)
Mean Plasma Glucose: 111 mg/dL (ref <117)

## 2012-12-31 LAB — LIPID PANEL
Cholesterol: 171 mg/dL (ref 0–200)
Total CHOL/HDL Ratio: 4.8 RATIO

## 2012-12-31 LAB — TSH
TSH: 2.892 u[IU]/mL (ref 0.350–4.500)
TSH: 1.754 u[IU]/mL (ref 0.350–4.500)

## 2012-12-31 NOTE — Progress Notes (Signed)
ETT was low risk. Excellent.

## 2012-12-31 NOTE — Progress Notes (Signed)
Subjective:  No CP, no further syncope, no bleeding. Son in room, states that he had a heart attack at age 42. Tele with sinus brady 45-50 at times.   Objective:  Vital Signs in the last 24 hours: Temp:  [98.3 F (36.8 C)] 98.3 F (36.8 C) (06/30 0500) Pulse Rate:  [45-55] 55 (06/30 0500) Resp:  [18-23] 18 (06/30 0500) BP: (108-134)/(53-82) 110/63 mmHg (06/30 0500) SpO2:  [95 %-99 %] 96 % (06/30 0500) Weight:  [54.432 kg (120 lb)-56.291 kg (124 lb 1.6 oz)] 54.885 kg (121 lb) (06/30 0500)  Intake/Output from previous day: 06/29 0701 - 06/30 0700 In: -  Out: 1 [Urine:1]   Physical Exam: General: Well developed, well nourished, in no acute distress. Head:  Normocephalic and atraumatic. Lungs: Clear to auscultation and percussion. Heart: Huston Foley reg.   No murmur, rubs or gallops.  Abdomen: soft, non-tender, positive bowel sounds. Extremities: No clubbing or cyanosis. No edema. Freckles.  Neurologic: Alert and oriented x 3.    Lab Results:  Recent Labs  12/30/12 1321 12/30/12 2006  WBC 10.2 10.0  HGB 14.2 13.6  PLT 333 314    Recent Labs  12/30/12 1321 12/30/12 2006  NA 136 137  K 4.3 4.1  CL 104 104  CO2 25 26  GLUCOSE 89 84  BUN 9 9  CREATININE 0.84 0.91    Recent Labs  12/30/12 1905 12/31/12 0105  TROPONINI <0.30 <0.30   Hepatic Function Panel  Recent Labs  12/30/12 2006  PROT 5.4*  ALBUMIN 3.0*  AST 9  ALT 7  ALKPHOS 62  BILITOT 0.4    Recent Labs  12/31/12 0105  CHOL 171   No results found for this basename: PROTIME,  in the last 72 hours  Imaging: Ct Head Wo Contrast  12/30/2012   *RADIOLOGY REPORT*  Clinical Data: Severe headache  CT HEAD WITHOUT CONTRAST  Technique:  Contiguous axial images were obtained from the base of the skull through the vertex without contrast.  Comparison: None.  Findings: Benign-appearing small cyst below the left basal ganglia as likely a cord plexus cyst or dilated perivascular space.  There is no midline  shift or acute intracranial hemorrhage. Ventricles system is within normal limits.  Cranium is intact.  Visualized paranasal sinuses and mastoid air cells are clear.  IMPRESSION: No evidence of acute injury.  Small cyst below the left basal ganglia as described.  This likely represents a dilated  perivascular space or correlate plexus cyst. Outpatient MRI can be performed to further characterize if symptoms correlate with the abnormality.   Original Report Authenticated By: Jolaine Click, M.D.     Telemetry: As above. Personally viewed.   EKG:  No st changes  Cardiac Studies:  ETT/ECHO pending  Assessment/Plan:   Syncope Bradycardia Arm pain Tobacco use  -ETT today -smoking cessation -asymptomatic bradycardia on tele noted (likely increased vagal tone) -Syncope during vomiting likely vagal    Jacob Richardson, Zakkery 12/31/2012, 9:22 AM

## 2012-12-31 NOTE — Progress Notes (Signed)
Echo Lab  2D Echocardiogram completed.  Trayson Stitely L Davanee Klinkner, RDCS 12/31/2012 10:39 AM

## 2012-12-31 NOTE — H&P (Addendum)
[facility] History and Physical  SHADOW SCHEDLER WUJ:[id number] DOB: [birth year] DOA: 12/30/2012  Referring physician:   PCP: Pcp Not In System  Specialists: Cardiology Chief Complaint: Syncope, head trauma  HPI: Jacob Richardson is a 42 y.o. WM no PMHx state positive left arm pain, positive substernal chest pain, negative shortness of breath, negative nausea/vomiting which occurred Saturday night. Patient did not seek treatment and symptoms spontaneously resolved. On admission positive nausea went to go to the bathroom and that the last thing patient remembers until the EMTs were walking him out of his house to their unit. Per patient's son patient fell struck his head on the toilet, and patient's son had to break the door in order to attend to the patient. No evidence of acute injury. Negative orthostatic blood pressure. All cardiac enzymes negative. TODAY feels back to his baseline like to be discharged  HEAD CT shows; Small cyst below the left basal ganglia, This likely represents a dilated perivascular space or correlate plexus cyst.  Brain MRI;Negative pulmonary hypertension 2-D cardiac echo; complete, findings consistent with mild pulmonary hypertension EST; awaiting final results next      Review of Systems: The patient denies anorexia, fever, weight loss,, vision loss, decreased hearing, hoarseness, c dyspnea on exertion, peripheral edema, balance deficits, hemoptysis, abdominal pain, melena, hematochezia, severe indigestion/heartburn, hematuria, incontinence, genital sores, muscle weakness, suspicious skin lesions, transient blindness, difficulty walking, depression, unusual weight change, abnormal bleeding, enlarged lymph nodes, angioedema, and breast masses.   Positive;hest pain, syncope,   History reviewed. No pertinent past medical history. Past Surgical History  Procedure Laterality Date  . Elbow surgery     Social History:  One pack per day x26 years, negative alcohol, plus  marijuana.   Family history; 9 year old son MI 56 yo  where does patient live--home, lives at home with family Can patient participate in ADLs? Yes  Allergies  Allergen Reactions  . Codeine Nausea And Vomiting and Other (See Comments)    Passes out    No family history on file.   Prior to Admission medications   Not on File   Physical Exam: Filed Vitals:   12/30/12 2000 12/30/12 2003 12/30/12 2006 12/31/12 0500  BP: 122/53 118/69 134/82 110/63  Pulse: 47 50 54 55  Temp: 98.3 F (36.8 C)   98.3 F (36.8 C)  TempSrc:      Resp: 18   18  Height:      Weight:    54.885 kg (121 lb)  SpO2: 99%   96%     General:  Alert and oriented x4,NAD  Eyes: Pupils equal round reactive to light and accommodation  Cardiovascular: Regular rhythm, bradycardic rate, negative murmurs rubs or gallops, PT/DP pulse +2  Respiratory: Clear to auscultation bilaterally  Abdomen: Soft, nontender, nondistended, plus bowel sounds   Neurologic: Alert and oriented x4, pupils equal round reactive to light and accommodation, cranial nerves II through XII intact, sensation intact throughout, motor strength 5/5 in all extremities.  Labs on Admission:  Basic Metabolic Panel:  Recent Labs Lab 12/30/12 1321 12/30/12 2006  NA 136 137  K 4.3 4.1  CL 104 104  CO2 25 26  GLUCOSE 89 84  BUN 9 9  CREATININE 0.84 0.91  CALCIUM 8.5 8.2*  MG  --  1.9   Liver Function Tests:  Recent Labs Lab 12/30/12 1321 12/30/12 2006  AST 10 9  ALT 9 7  ALKPHOS 68 62  BILITOT 0.4 0.4  PROT 5.9* 5.4*  ALBUMIN 3.1* 3.0*    Recent Labs Lab 12/30/12 1321  LIPASE 24   No results found for this basename: AMMONIA,  in the last 168 hours CBC:  Recent Labs Lab 12/30/12 1321 12/30/12 2006  WBC 10.2 10.0  NEUTROABS 6.7 5.0  HGB 14.2 13.6  HCT 41.9 40.0  MCV 93.3 93.7  PLT 333 314   Cardiac Enzymes:  Recent Labs Lab 12/30/12 1905 12/31/12 0105 12/31/12 0918  TROPONINI <0.30 <0.30 <0.30     BNP (last 3 results)  Recent Labs  12/30/12 2005  PROBNP 72.7   CBG: No results found for this basename: GLUCAP,  in the last 168 hours  Radiological Exams on Admission: Ct Head Wo Contrast  12/30/2012   *RADIOLOGY REPORT*  Clinical Data: Severe headache  CT HEAD WITHOUT CONTRAST  Technique:  Contiguous axial images were obtained from the base of the skull through the vertex without contrast.  Comparison: None.  Findings: Benign-appearing small cyst below the left basal ganglia as likely a cord plexus cyst or dilated perivascular space.  There is no midline shift or acute intracranial hemorrhage. Ventricles system is within normal limits.  Cranium is intact.  Visualized paranasal sinuses and mastoid air cells are clear.  IMPRESSION: No evidence of acute injury.  Small cyst below the left basal ganglia as described.  This likely represents a dilated  perivascular space or correlate plexus cyst. Outpatient MRI can be performed to further characterize if symptoms correlate with the abnormality.   Original Report Authenticated By: Jolaine Click, M.D.    EKG: No previous EKG for comparison sinus bradycardia, (45 BPM), otherwise normal EKG.   Assessment/Plan 1.  syncope with head trauma. CMP negative for electrolyte imbalance , thyroid panel within normal limits,  cardiac enzymes negative x3, obtain a drug screen still pending, Patient has been seen by cardiology who believes this is an episode vasovagal vice a structural abnormality. If final EST results are within normal limits will discharge later this evening or in the a.m.  2. Nicotine addiction; counseled patient: Need to discontinue use of nicotine products    Code Status:Full  Family Communication: Plan communicated to son and patient Disposition Plan:? Time spent: 40 minutes  Drema Dallas [facility] Pager [phone]  If 7PM-7AM, please contact night-coverage [email or web address] Password [email or web address] 12/31/2012, 10:46  AM

## 2013-01-01 DIAGNOSIS — R9431 Abnormal electrocardiogram [ECG] [EKG]: Secondary | ICD-10-CM

## 2013-01-01 DIAGNOSIS — S1093XA Contusion of unspecified part of neck, initial encounter: Secondary | ICD-10-CM

## 2013-01-01 DIAGNOSIS — I498 Other specified cardiac arrhythmias: Secondary | ICD-10-CM

## 2013-01-01 DIAGNOSIS — F17200 Nicotine dependence, unspecified, uncomplicated: Secondary | ICD-10-CM

## 2013-01-01 DIAGNOSIS — S0003XA Contusion of scalp, initial encounter: Secondary | ICD-10-CM

## 2013-01-01 DIAGNOSIS — R001 Bradycardia, unspecified: Secondary | ICD-10-CM

## 2013-01-01 DIAGNOSIS — R55 Syncope and collapse: Principal | ICD-10-CM

## 2013-01-01 LAB — URINE DRUGS OF ABUSE SCREEN W ALC, ROUTINE (REF LAB)
Barbiturate Quant, Ur: NEGATIVE
Benzodiazepines.: NEGATIVE
Cocaine Metabolites: NEGATIVE
Creatinine,U: 116.4 mg/dL
Ethyl Alcohol: <10 mg/dL (ref <10)
Opiate Screen, Urine: POSITIVE — AB
Phencyclidine (PCP): NEGATIVE

## 2013-01-01 NOTE — Discharge Summary (Signed)
Physician Discharge Summary  Jacob Richardson [id number]:[id number] DOB: 03-24-1971 DOA: 12/30/2012  PCP: Pcp Not In System  Admit date: 12/30/2012 Discharge date: 01/01/2013  Time spent: 30 minutes  Recommendations for Outpatient Follow-up:  1. Syncope; most likely vasovagal. Patient counseled that with his low baseline heart rate he would need to maintain good hydration especially during the wall months. Counseled followup with his PCP Dr. Megan Mans within 7 days 2. Nicotinic addiction; patient counseled on consequences of continuing to smoke, to include vasoconstriction which could play into future episodes of syncope. 3. Bradycardia; patient has baseline bradycardia no further workup indicated  Discharge Diagnoses:  Principal Problem:   Syncope Active Problems:   Nicotine addiction   Bradycardia   Discharge Condition: Stable  Diet recommendation: Heart Healthy  Filed Weights   12/30/12 1859 12/31/12 0500 01/01/13 0620  Weight: 56.291 kg (124 lb 1.6 oz) 54.885 kg (121 lb) 56.155 kg (123 lb 12.8 oz)    History of present illness:  Jacob Richardson is a 42 y.o. WM no PMHx state positive left arm pain, positive substernal chest pain, negative shortness of breath, negative nausea/vomiting which occurred Saturday night. Patient did not seek treatment and symptoms spontaneously resolved. On admission positive nausea went to go to the bathroom and that the last thing patient remembers until the EMTs were walking him out of his house to their unit. Per patient's son patient fell struck his head on the toilet, and patient's son had to break the door in order to attend to the patient. No evidence of acute injury. Negative orthostatic blood pressure. All cardiac enzymes negative. TODAY feels back to his baseline would  like to be discharged   Hospital Course:  Patient and the/29/2014 secondary to a witnessed syncopal event. The patient has a baseline bradycardia without EKG changes. Cardiac workup was negative  for any cardiovascular disease. Patient baseline will discharge today  Consultations:  Cardiology  Discharge Exam: Filed Vitals:   01/01/13 0620 01/01/13 0622 01/01/13 0624 01/01/13 0627  BP: 107/62 114/76 112/70 118/79  Pulse: 48     Temp: 98 F (36.7 C)     TempSrc: Oral     Resp: 16     Height:      Weight: 56.155 kg (123 lb 12.8 oz)     SpO2: 97%       General: Alert,NAD Cardiovascular: Regular rhythm, bradycardic, negative murmurs rubs or gallops, DP/PT pulses 2+ bilateral Respiratory: Clear to auscultation bilaterally  Discharge Instructions     Medication List    Notice   You have not been prescribed any medications.     Allergies  Allergen Reactions  . Codeine Nausea And Vomiting and Other (See Comments)    Passes out      The results of significant diagnostics from this hospitalization (including imaging, microbiology, ancillary and laboratory) are listed below for reference.    Significant Diagnostic Studies: Ct Head Wo Contrast  12/30/2012   *RADIOLOGY REPORT*  Clinical Data: Severe headache  CT HEAD WITHOUT CONTRAST  Technique:  Contiguous axial images were obtained from the base of the skull through the vertex without contrast.  Comparison: None.  Findings: Benign-appearing small cyst below the left basal ganglia as likely a cord plexus cyst or dilated perivascular space.  There is no midline shift or acute intracranial hemorrhage. Ventricles system is within normal limits.  Cranium is intact.  Visualized paranasal sinuses and mastoid air cells are clear.  IMPRESSION: No evidence of acute injury.  Small cyst  below the left basal ganglia as described.  This likely represents a dilated  perivascular space or correlate plexus cyst. Outpatient MRI can be performed to further characterize if symptoms correlate with the abnormality.   Original Report Authenticated By: Jolaine Click, M.D.   Mr Brain Wo Contrast  12/31/2012   *RADIOLOGY REPORT*  Clinical Data:  Syncope.  Fell and hit head  MRI HEAD WITHOUT CONTRAST  Technique:  Multiplanar, multiecho pulse sequences of the brain and surrounding structures were obtained according to standard protocol without intravenous contrast.  Comparison: CT 12/30/2012  Findings: Ventricle size is normal.  Negative for acute infarct. No significant chronic ischemia.  Cerebral white matter is normal. Brainstem and cerebellum are normal.  Negative for hemorrhage or fluid collection.  No mass or edema.  IMPRESSION: Negative   Original Report Authenticated By: Janeece Riggers, M.D.    Microbiology: No results found for this or any previous visit (from the past 240 hour(s)).   Labs: Basic Metabolic Panel:  Recent Labs Lab 12/30/12 1321 12/30/12 2006  NA 136 137  K 4.3 4.1  CL 104 104  CO2 25 26  GLUCOSE 89 84  BUN 9 9  CREATININE 0.84 0.91  CALCIUM 8.5 8.2*  MG  --  1.9   Liver Function Tests:  Recent Labs Lab 12/30/12 1321 12/30/12 2006  AST 10 9  ALT 9 7  ALKPHOS 68 62  BILITOT 0.4 0.4  PROT 5.9* 5.4*  ALBUMIN 3.1* 3.0*    Recent Labs Lab 12/30/12 1321  LIPASE 24   No results found for this basename: AMMONIA,  in the last 168 hours CBC:  Recent Labs Lab 12/30/12 1321 12/30/12 2006  WBC 10.2 10.0  NEUTROABS 6.7 5.0  HGB 14.2 13.6  HCT 41.9 40.0  MCV 93.3 93.7  PLT 333 314   Cardiac Enzymes:  Recent Labs Lab 12/30/12 1905 12/31/12 0105 12/31/12 0918  TROPONINI <0.30 <0.30 <0.30   BNP: BNP (last 3 results)  Recent Labs  12/30/12 2005  PROBNP 72.7   CBG: No results found for this basename: GLUCAP,  in the last 168 hours     Signed:  Carolyne Littles, J  [facility] 01/01/2013, 11:47 AM

## 2013-01-01 NOTE — Progress Notes (Signed)
Subjective:  No complaints, no further syncope. No CP, no SOB. Son at bedside.   Objective:  Vital Signs in the last 24 hours: Temp:  [97.3 F (36.3 C)-98.6 F (37 C)] 98 F (36.7 C) (07/01 0620) Pulse Rate:  [48-51] 48 (07/01 0620) Resp:  [16] 16 (07/01 0620) BP: (107-118)/(62-79) 118/79 mmHg (07/01 0627) SpO2:  [97 %-98 %] 97 % (07/01 0620) Weight:  [56.155 kg (123 lb 12.8 oz)] 56.155 kg (123 lb 12.8 oz) (07/01 0620)  Intake/Output from previous day: 06/30 0701 - 07/01 0700 In: 480 [P.O.:480] Out: 2 [Urine:2]   Physical Exam: General: Well developed, well nourished, in no acute distress. Thin Head:  Normocephalic and atraumatic. Lungs: Clear to auscultation and percussion. Heart: Huston Foley reg.  No murmur, rubs or gallops.  Abdomen: soft, non-tender, positive bowel sounds. Extremities: No clubbing or cyanosis. No edema. Neurologic: Alert and oriented x 3.    Lab Results:  Recent Labs  12/30/12 1321 12/30/12 2006  WBC 10.2 10.0  HGB 14.2 13.6  PLT 333 314    Recent Labs  12/30/12 1321 12/30/12 2006  NA 136 137  K 4.3 4.1  CL 104 104  CO2 25 26  GLUCOSE 89 84  BUN 9 9  CREATININE 0.84 0.91    Recent Labs  12/31/12 0105 12/31/12 0918  TROPONINI <0.30 <0.30   Hepatic Function Panel  Recent Labs  12/30/12 2006  PROT 5.4*  ALBUMIN 3.0*  AST 9  ALT 7  ALKPHOS 62  BILITOT 0.4    Recent Labs  12/31/12 0105  CHOL 171   No results found for this basename: PROTIME,  in the last 72 hours  Imaging: Ct Head Wo Contrast  12/30/2012   *RADIOLOGY REPORT*  Clinical Data: Severe headache  CT HEAD WITHOUT CONTRAST  Technique:  Contiguous axial images were obtained from the base of the skull through the vertex without contrast.  Comparison: None.  Findings: Benign-appearing small cyst below the left basal ganglia as likely a cord plexus cyst or dilated perivascular space.  There is no midline shift or acute intracranial hemorrhage. Ventricles system is  within normal limits.  Cranium is intact.  Visualized paranasal sinuses and mastoid air cells are clear.  IMPRESSION: No evidence of acute injury.  Small cyst below the left basal ganglia as described.  This likely represents a dilated  perivascular space or correlate plexus cyst. Outpatient MRI can be performed to further characterize if symptoms correlate with the abnormality.   Original Report Authenticated By: Jolaine Click, M.D.   Mr Brain Wo Contrast  12/31/2012   *RADIOLOGY REPORT*  Clinical Data: Syncope.  Fell and hit head  MRI HEAD WITHOUT CONTRAST  Technique:  Multiplanar, multiecho pulse sequences of the brain and surrounding structures were obtained according to standard protocol without intravenous contrast.  Comparison: CT 12/30/2012  Findings: Ventricle size is normal.  Negative for acute infarct. No significant chronic ischemia.  Cerebral white matter is normal. Brainstem and cerebellum are normal.  Negative for hemorrhage or fluid collection.  No mass or edema.  IMPRESSION: Negative   Original Report Authenticated By: Janeece Riggers, M.D.   Personally viewed.   Telemetry: Sinus brady otherwise normal. Personally viewed.    Cardiac Studies:  ECHO normal EF, ETT low risk  Assessment/Plan:   42 year old with syncope, head trauma, in the setting of emesis.  1) Syncope  - Vasovagal etiology  - Resting bradycardia indicates increased vagal tone  - Cardiac work up reassuring as above  -  Encourage hydration, conservative mgt.  OK with DC home. Follow up with me PRN.   2) TOB  - encourage cessation.       Jacob Richardson, Terel 01/01/2013, 8:56 AM

## 2013-01-01 NOTE — Care Management (Signed)
CM provided pt with information to PCP at the [facility]. Pt to call and set up appointment. No further needs from CM at this time. Gala Lewandowsky, RN,BSN [phone]

## 2013-01-01 NOTE — ED Provider Notes (Signed)
Medical screening examination/treatment/procedure(s) were performed by non-physician practitioner and as supervising physician I was immediately available for consultation/collaboration.   Konstantina Nachreiner M Oluwatomisin Hustead, DO 01/01/13 1320 

## 2013-01-02 LAB — OPIATE, QUANTITATIVE, URINE
Codeine Urine: NEGATIVE ng/mL
Hydromorphone GC/MS Conf: NEGATIVE ng/mL
Morphine, Confirm: 659 ng/mL — ABNORMAL HIGH
Norhydrocodone, Ur: NEGATIVE ng/mL
Oxymorphone: NEGATIVE ng/mL

## 2014-06-18 IMAGING — CR DG CHEST 2V
2 series · 2 of 2 positions shown · non-contrast
Comparison: Chest radiograph performed 08/01/2008

CLINICAL DATA: Chest congestion, cough, fever, shortness of breath,
nausea and vomiting.

CHEST - 2 VIEW

[w chest pa]
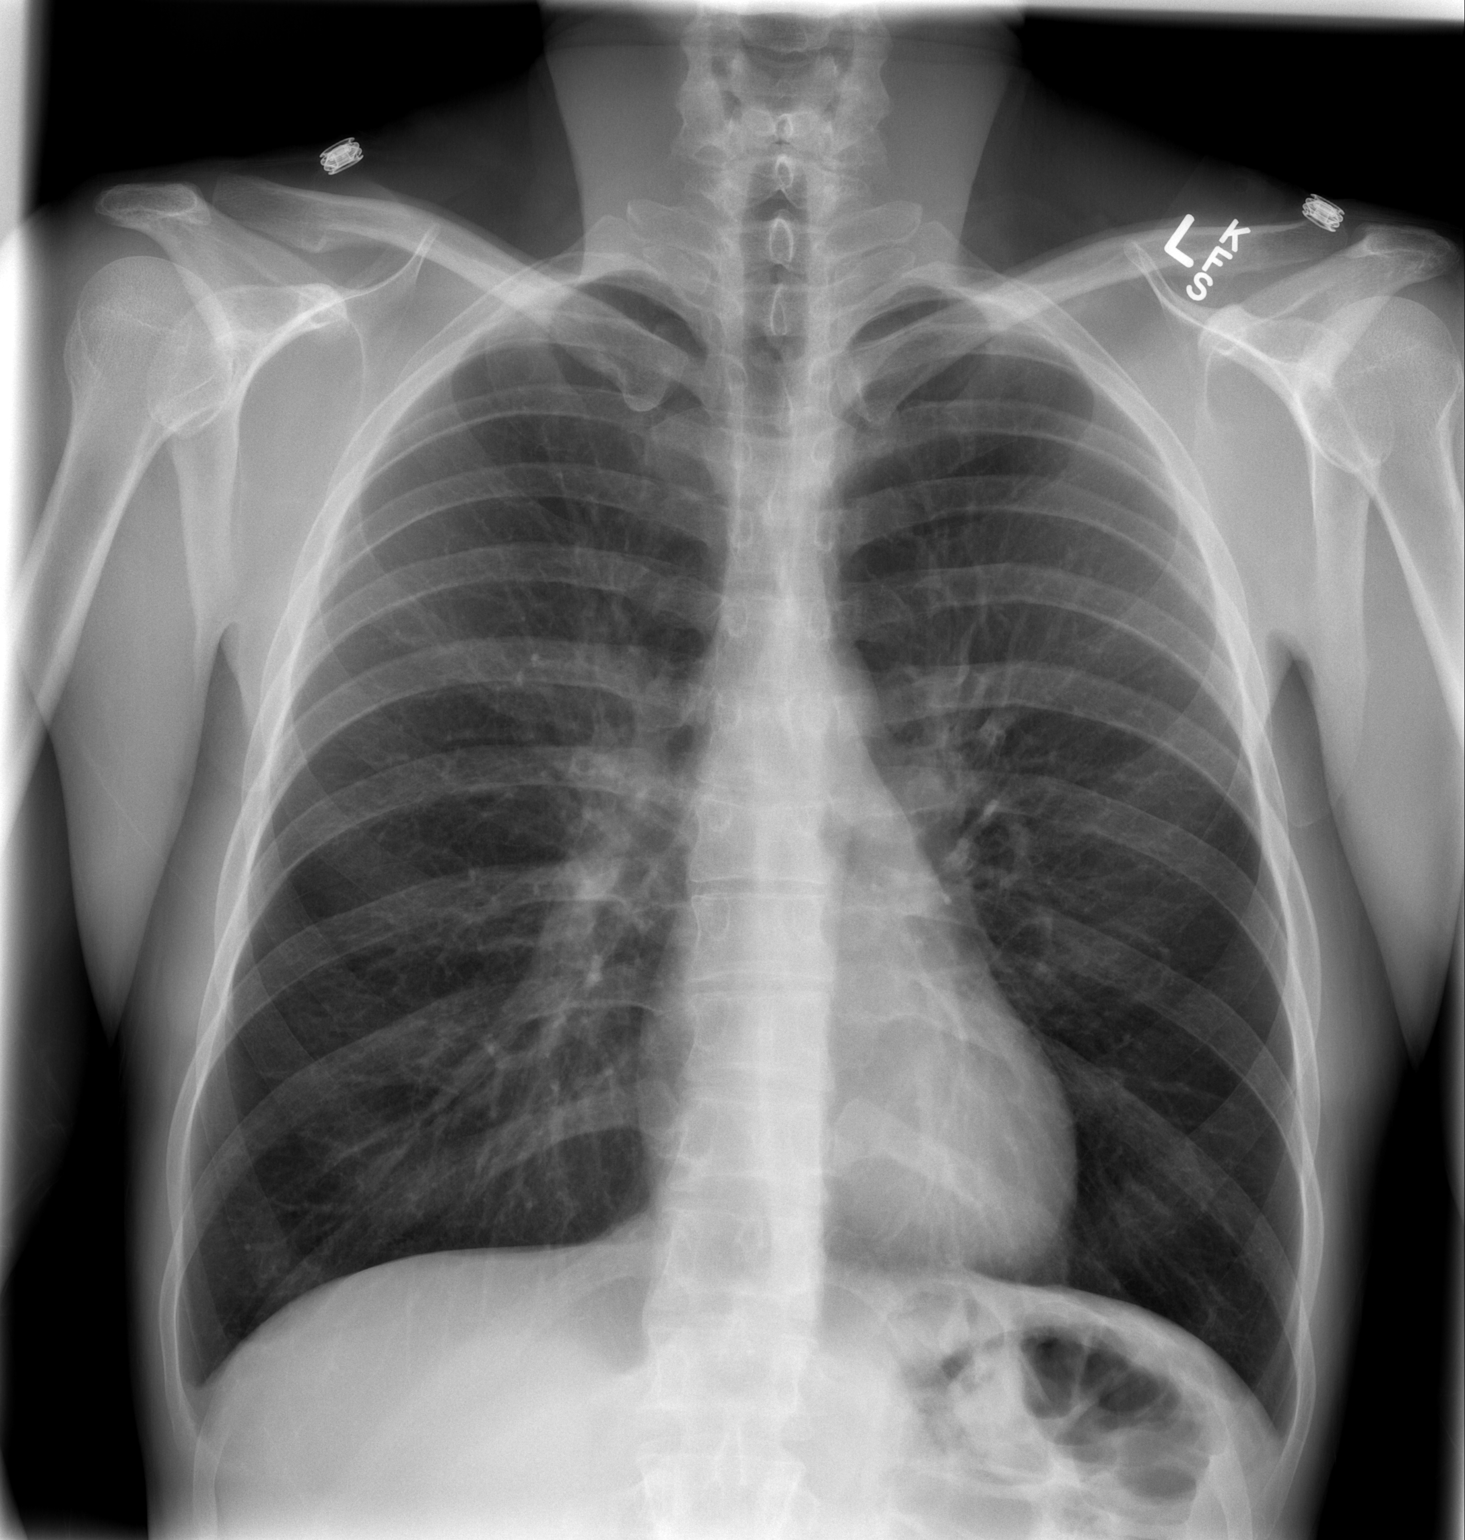

[w chest lat]
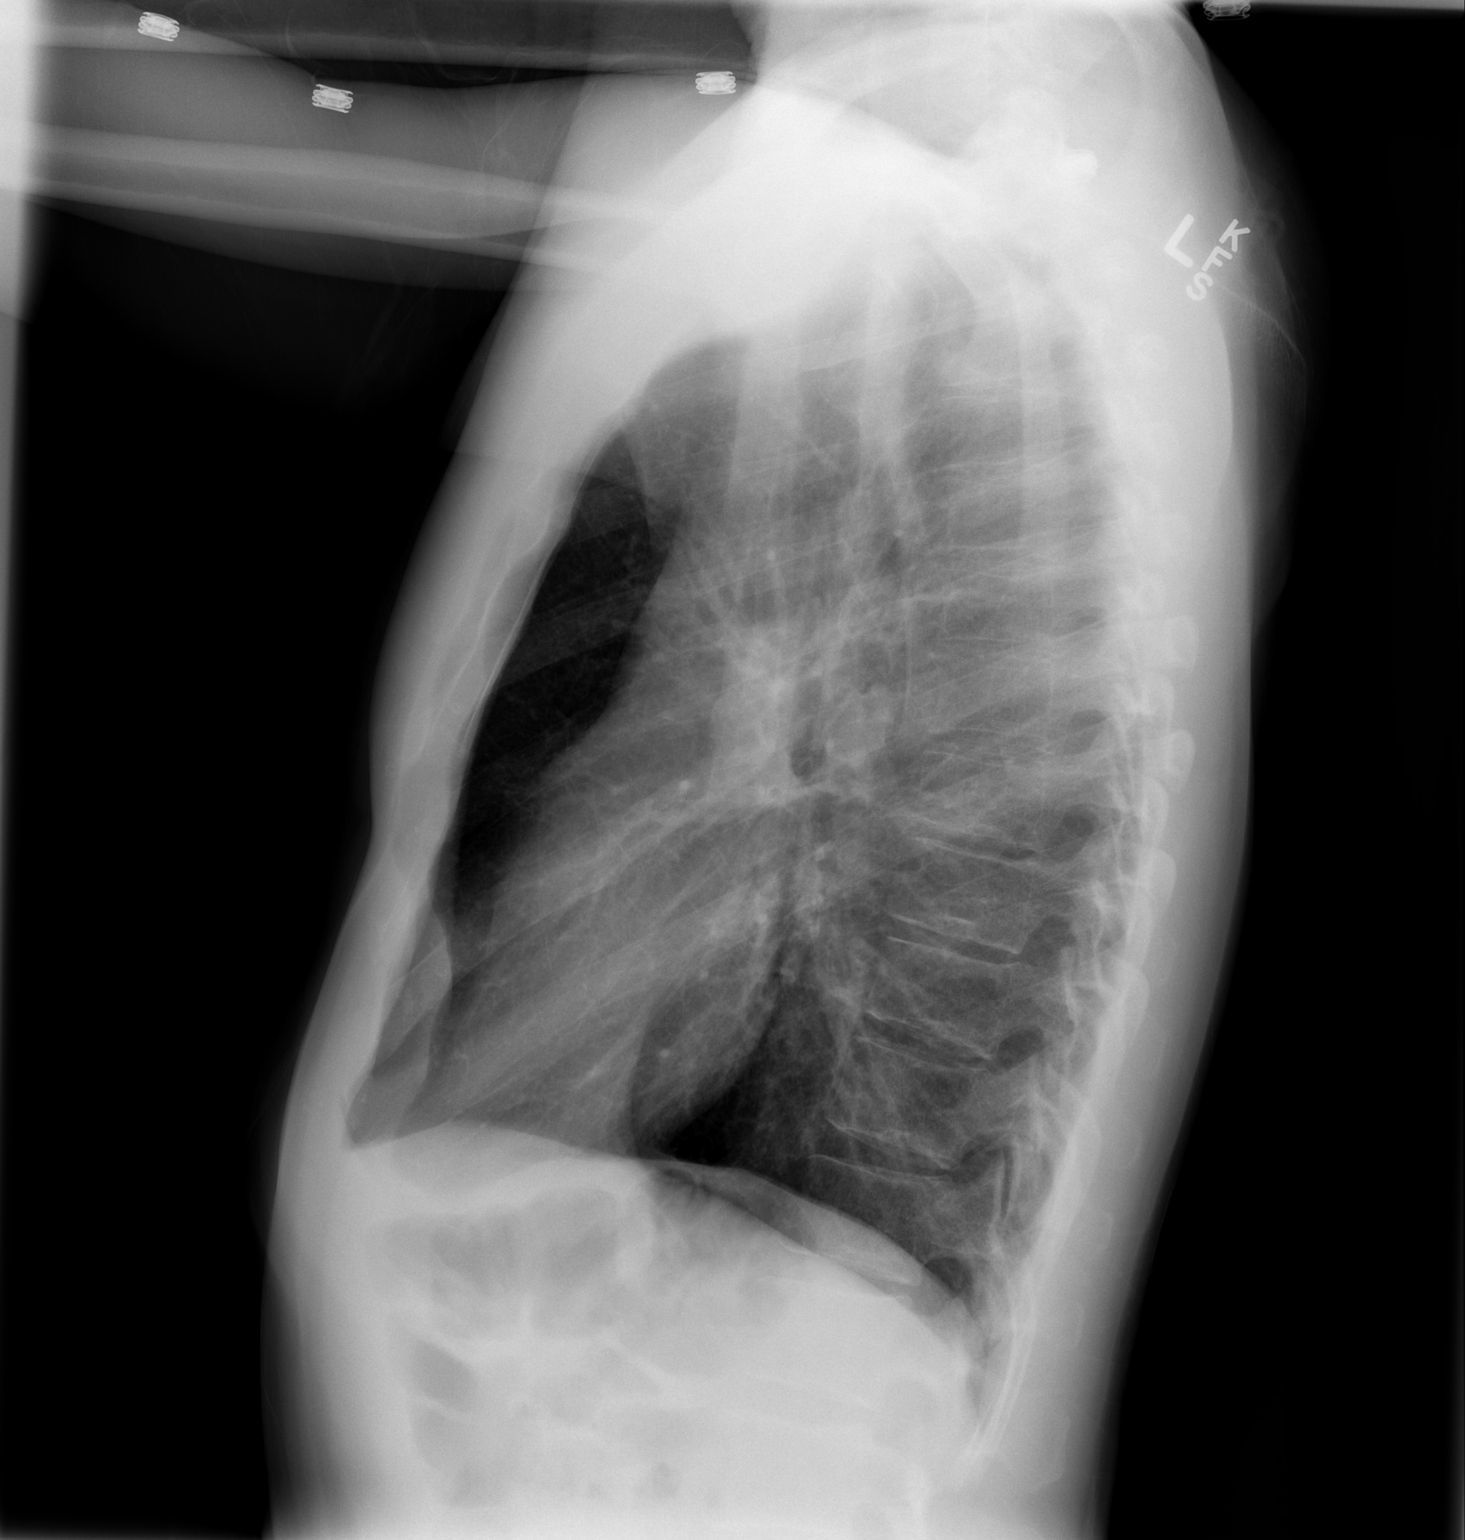

[2 of 2 positions shown; findings below may reference images not displayed]

FINDINGS: The lungs are well-aerated and clear.  There is no
evidence of focal opacification, pleural effusion or pneumothorax.

The heart is normal in size; the mediastinal contour is within
normal limits.  No acute osseous abnormalities are seen.
IMPRESSION: No acute cardiopulmonary process seen.

## 2016-06-07 ENCOUNTER — Encounter (HOSPITAL_COMMUNITY): Payer: Self-pay | Admitting: *Deleted

## 2016-06-07 DIAGNOSIS — L739 Follicular disorder, unspecified: Secondary | ICD-10-CM | POA: Insufficient documentation

## 2016-06-07 DIAGNOSIS — N451 Epididymitis: Secondary | ICD-10-CM | POA: Insufficient documentation

## 2016-06-07 DIAGNOSIS — F17210 Nicotine dependence, cigarettes, uncomplicated: Secondary | ICD-10-CM | POA: Insufficient documentation

## 2016-06-07 NOTE — ED Triage Notes (Signed)
Pt c/o right groin pain that started x 2 days ago

## 2016-06-08 ENCOUNTER — Emergency Department (HOSPITAL_COMMUNITY)
Admission: EM | Admit: 2016-06-08 | Discharge: 2016-06-08 | Disposition: A | Discharge status: Home / Self Care | Payer: Medicaid Other | Attending: Emergency Medicine | Admitting: Emergency Medicine

## 2016-06-08 DIAGNOSIS — L739 Follicular disorder, unspecified: Secondary | ICD-10-CM

## 2016-06-08 DIAGNOSIS — N451 Epididymitis: Secondary | ICD-10-CM

## 2016-06-08 LAB — URINALYSIS, ROUTINE W REFLEX MICROSCOPIC
BILIRUBIN URINE: NEGATIVE
Glucose, UA: NEGATIVE mg/dL
HGB URINE DIPSTICK: NEGATIVE
Ketones, ur: NEGATIVE mg/dL
Leukocytes, UA: NEGATIVE
Nitrite: NEGATIVE
PH: 5.5 (ref 5.0–8.0)
Protein, ur: NEGATIVE mg/dL
SPECIFIC GRAVITY, URINE: 1.015 (ref 1.005–1.030)

## 2016-06-08 MED ORDER — CIPROFLOXACIN HCL 500 MG PO TABS: 500.0000 mg | ORAL_TABLET | Freq: Two times a day (BID) | ORAL | 0 refills | Status: DC

## 2016-06-08 MED ORDER — NAPROXEN 250 MG PO TABS
250.0000 mg | ORAL_TABLET | Freq: Once | ORAL | Status: AC
  Administered 2016-06-08: 250 mg via ORAL
  Filled 2016-06-08: qty 1

## 2016-06-08 MED ORDER — DOXYCYCLINE HYCLATE 100 MG PO CAPS: 100.0000 mg | ORAL_CAPSULE | Freq: Two times a day (BID) | ORAL | 0 refills | Status: DC

## 2016-06-08 MED ORDER — NAPROXEN 250 MG PO TABS: ORAL_TABLET | ORAL | 0 refills | Status: DC

## 2016-06-08 NOTE — Discharge Instructions (Signed)
Take the antibiotics until gone. Take the naproxen for pain. Sometimes an athletic support will help with the discomfort. Recheck if you get a fever, vomiting, pain in your testicles or the red areas get bigger.

## 2016-06-08 NOTE — ED Provider Notes (Signed)
[facility]EMERGENCY DEPT Provider Note   CSN: [id number] Arrival date & time: 06/07/16  2323  Time seen 02:47 AM   History   Chief Complaint Chief Complaint  Patient presents with  . Groin Pain    HPI Jacob Richardson is a 45 y.o. male.  HPI patient states he started getting pain in his "private parts" and it is worse when he coughs and with urination.  Nothing makes it feel better. He describes a lump on his scrotum that is draining at times. He also describes some tenderness on either side of his genitalia that are lumps. He denies any fever, dysuria, or frequency. He denies any prior history of abscesses on his body. He states he's had something similar before but only lasted about a day.  PCP Dr Selena Battenkim  Past Medical History:  Diagnosis Date  . Dysrhythmia 12/30/2012   bradycardia    . Syncope and collapse 12/30/2012    Patient Active Problem List   Diagnosis Date Noted  . Bradycardia 01/01/2013  . Nicotine addiction 12/31/2012  . Syncope 12/31/2012    Past Surgical History:  Procedure Laterality Date  . ELBOW SURGERY         Home Medications    Prior to Admission medications   Medication Sig Start Date End Date Taking? Authorizing Provider  ciprofloxacin (CIPRO) 500 MG tablet Take 1 tablet (500 mg total) by mouth 2 (two) times daily. 06/08/16   Devoria AlbeIva Kadee Philyaw, MD  doxycycline (VIBRAMYCIN) 100 MG capsule Take 1 capsule (100 mg total) by mouth 2 (two) times daily. 06/08/16   Devoria AlbeIva Emmajean Ratledge, MD  naproxen (NAPROSYN) 250 MG tablet Take 1 po BID with food prn pain 06/08/16   Devoria AlbeIva Heavan Francom, MD    Family History History reviewed. No pertinent family history.  Social History Social History  Substance Use Topics  . Smoking status: Current Every Day Smoker    Packs/day: 1.00    Years: 25.00    Types: Cigarettes  . Smokeless tobacco: Never Used  . Alcohol use No  employed in [employer]   Allergies   Codeine   Review of Systems Review of Systems  Constitutional: Negative.  Negative  for activity change, appetite change and fever.  Eyes: Negative.   Respiratory: Negative.   Cardiovascular: Negative.   All other systems reviewed and are negative.    Physical Exam Updated Vital Signs BP 130/72 (BP Location: Left Arm)   Pulse 61   Temp 97.7 F (36.5 C) (Oral)   Resp 20   Ht 5\' 7"  (1.702 m)   Wt 124 lb (56.2 kg)   SpO2 99%   BMI 19.42 kg/m   Vital signs normal    Physical Exam  Constitutional: He appears well-developed and well-nourished. No distress.  HENT:  Head: Normocephalic and atraumatic.  Right Ear: External ear normal.  Left Ear: External ear normal.  Nose: Nose normal.  Eyes: Conjunctivae and EOM are normal.  Neck: Normal range of motion.  Cardiovascular: Normal rate.   Pulmonary/Chest: Effort normal. No respiratory distress.  Abdominal: Soft. He exhibits no distension. There is no tenderness.  Genitourinary: Penis normal.  Genitourinary Comments: Patient is tender bilaterally over the epididymis that reproduces a lot of his complaints of pain. His testicles are normal size and nontender. On his anterior right scrotum he has a area of redness that is about a half a centimeters in size without obvious abscess formation yet. There is an area where it looks like it has been draining with some scab  formation. He also has inflamed hair follicle at the base of his penis that he states is sore.     ED Treatments / Results  Labs (all labs ordered are listed, but only abnormal results are displayed) Results for orders placed or performed during the hospital encounter of 06/08/16  Urinalysis, Routine w reflex microscopic  Result Value Ref Range   Color, Urine YELLOW YELLOW   APPearance CLEAR CLEAR   Specific Gravity, Urine 1.015 1.005 - 1.030   pH 5.5 5.0 - 8.0   Glucose, UA NEGATIVE NEGATIVE mg/dL   Hgb urine dipstick NEGATIVE NEGATIVE   Bilirubin Urine NEGATIVE NEGATIVE   Ketones, ur NEGATIVE NEGATIVE mg/dL   Protein, ur NEGATIVE NEGATIVE mg/dL    Nitrite NEGATIVE NEGATIVE   Leukocytes, UA NEGATIVE NEGATIVE   Laboratory interpretation all normal     Procedures Procedures (including critical care time)  Medications Ordered in ED Medications  naproxen (NAPROSYN) tablet 250 mg (not administered)     Initial Impression / Assessment and Plan / ED Course  I have reviewed the triage vital signs and the nursing notes.  Pertinent labs & imaging results that were available during my care of the patient were reviewed by me and considered in my medical decision making (see chart for details).  Clinical Course    Patient's exam is consistent with epididymitis. He has no pain to his testicles so ultrasound was not done. He appears to be trying to form an abscess on his scrotum although it has been spontaneously draining. He was discharged home on antibiotics for the epididymitis in the small scrotal abscess.  Final Clinical Impressions(s) / ED Diagnoses   Final diagnoses:  Epididymitis  Folliculitis    New Prescriptions New Prescriptions   CIPROFLOXACIN (CIPRO) 500 MG TABLET    Take 1 tablet (500 mg total) by mouth 2 (two) times daily.   DOXYCYCLINE (VIBRAMYCIN) 100 MG CAPSULE    Take 1 capsule (100 mg total) by mouth 2 (two) times daily.   NAPROXEN (NAPROSYN) 250 MG TABLET    Take 1 po BID with food prn pain    Plan discharge  Devoria AlbeIva Franklin [place], MD, Concha PyoFACEP    Avalina Benko, MD 06/08/16 [phone]

## 2018-01-08 ENCOUNTER — Encounter (HOSPITAL_COMMUNITY): Payer: Self-pay

## 2018-01-08 ENCOUNTER — Other Ambulatory Visit: Payer: Self-pay

## 2018-01-08 ENCOUNTER — Emergency Department (HOSPITAL_COMMUNITY)
Admission: EM | Admit: 2018-01-08 | Discharge: 2018-01-08 | Disposition: A | Discharge status: Home / Self Care | Payer: BLUE CROSS/BLUE SHIELD | Attending: Emergency Medicine | Admitting: Emergency Medicine

## 2018-01-08 DIAGNOSIS — L02412 Cutaneous abscess of left axilla: Secondary | ICD-10-CM

## 2018-01-08 DIAGNOSIS — F17210 Nicotine dependence, cigarettes, uncomplicated: Secondary | ICD-10-CM | POA: Insufficient documentation

## 2018-01-08 DIAGNOSIS — M79622 Pain in left upper arm: Secondary | ICD-10-CM | POA: Diagnosis not present

## 2018-01-08 DIAGNOSIS — Z79899 Other long term (current) drug therapy: Secondary | ICD-10-CM | POA: Insufficient documentation

## 2018-01-08 MED ORDER — IBUPROFEN 600 MG PO TABS: 600.0000 mg | ORAL_TABLET | Freq: Three times a day (TID) | ORAL | 0 refills | Status: DC

## 2018-01-08 MED ORDER — ACETAMINOPHEN 500 MG PO TABS
1000.0000 mg | ORAL_TABLET | Freq: Once | ORAL | Status: AC
  Administered 2018-01-08: 1000 mg via ORAL
  Filled 2018-01-08: qty 2

## 2018-01-08 MED ORDER — IBUPROFEN 800 MG PO TABS
800.0000 mg | ORAL_TABLET | Freq: Once | ORAL | Status: AC
  Administered 2018-01-08: 800 mg via ORAL
  Filled 2018-01-08: qty 1

## 2018-01-08 MED ORDER — DOXYCYCLINE HYCLATE 100 MG PO CAPS: 100.0000 mg | ORAL_CAPSULE | Freq: Two times a day (BID) | ORAL | 0 refills | Status: DC

## 2018-01-08 MED ORDER — DOXYCYCLINE HYCLATE 100 MG PO TABS
100.0000 mg | ORAL_TABLET | Freq: Once | ORAL | Status: AC
  Administered 2018-01-08: 100 mg via ORAL
  Filled 2018-01-08: qty 1

## 2018-01-08 NOTE — ED Provider Notes (Signed)
[facility] EMERGENCY DEPARTMENT Provider Note   CSN: [id number] Arrival date & time: 01/08/18  1947     History   Chief Complaint Chief Complaint  Patient presents with  . Abscess    left axilla    HPI Jacob Richardson is a 47 y.o. male.  Patient is a 47 year old male who presents to the emergency department with a complaint of abscess to his under arm.  The patient states that something may have bit him.  He developed a sore, and then a red raised area.  The patient states that he has been squeezing on it trying to get stuff to come out.  He had some white material to come out, but now the area has gotten larger and more teary and tender.  The patient denies fever or chills.  He denies any unusual swelling of his under arm or up or his arm.  He presents now for assistance with this issue.  No history of methicillin-resistant staph according to the patient.  The patient is not diabetic, and has no medical illnesses that would compromise the immune system.     Past Medical History:  Diagnosis Date  . Dysrhythmia 12/30/2012   bradycardia    . Syncope and collapse 12/30/2012    Patient Active Problem List   Diagnosis Date Noted  . Bradycardia 01/01/2013  . Nicotine addiction 12/31/2012  . Syncope 12/31/2012    Past Surgical History:  Procedure Laterality Date  . ELBOW SURGERY          Home Medications    Prior to Admission medications   Medication Sig Start Date End Date Taking? Authorizing Provider  ciprofloxacin (CIPRO) 500 MG tablet Take 1 tablet (500 mg total) by mouth 2 (two) times daily. 06/08/16   Devoria Albe, MD  doxycycline (VIBRAMYCIN) 100 MG capsule Take 1 capsule (100 mg total) by mouth 2 (two) times daily. 06/08/16   Devoria Albe, MD  naproxen (NAPROSYN) 250 MG tablet Take 1 po BID with food prn pain 06/08/16   Devoria Albe, MD    Family History No family history on file.  Social History Social History   Tobacco Use  . Smoking status: Current Every Day  Smoker    Packs/day: 1.00    Years: 25.00    Pack years: 25.00    Types: Cigarettes  . Smokeless tobacco: Never Used  Substance Use Topics  . Alcohol use: No  . Drug use: Yes    Types: Marijuana     Allergies   Codeine   Review of Systems Review of Systems  Constitutional: Negative for activity change.       All ROS Neg except as noted in HPI  HENT: Negative for nosebleeds.   Eyes: Negative for photophobia and discharge.  Respiratory: Negative for cough, shortness of breath and wheezing.   Cardiovascular: Negative for chest pain and palpitations.  Gastrointestinal: Negative for abdominal pain and blood in stool.  Genitourinary: Negative for dysuria, frequency and hematuria.  Musculoskeletal: Negative for arthralgias, back pain and neck pain.  Skin: Positive for wound.       Abscess  Neurological: Negative for dizziness, seizures and speech difficulty.  Psychiatric/Behavioral: Negative for confusion and hallucinations.     Physical Exam Updated Vital Signs BP 122/83 (BP Location: Right Arm)   Pulse 98   Temp 98.6 F (37 C) (Oral)   Resp 17   Ht 5\' 7"  (1.702 m)   Wt 58.1 kg (128 lb)   SpO2 97%  BMI 20.05 kg/m   Physical Exam  Constitutional: He is oriented to person, place, and time. He appears well-developed and well-nourished.  Non-toxic appearance.  HENT:  Head: Normocephalic.  Right Ear: Tympanic membrane and external ear normal.  Left Ear: Tympanic membrane and external ear normal.  Eyes: Pupils are equal, round, and reactive to light. EOM and lids are normal.  Neck: Normal range of motion. Neck supple. Carotid bruit is not present.  Cardiovascular: Normal rate, regular rhythm, normal heart sounds, intact distal pulses and normal pulses.  Pulmonary/Chest: Breath sounds normal. No respiratory distress.  Abdominal: Soft. Bowel sounds are normal. There is no tenderness. There is no guarding.  Musculoskeletal: Normal range of motion.  Patient has a small  red raised area of the left axilla.  There is an open area with a partial scab in the center.  There is minimal drainage from the area at this time.  There are no red streaks of the left axilla.  The area is not hot.  There is full range of motion of the left upper extremity without problem.  The radial pulses 2+, the capillary refill is less than 3 seconds.  Lymphadenopathy:       Head (right side): No submandibular adenopathy present.       Head (left side): No submandibular adenopathy present.    He has no cervical adenopathy.  Neurological: He is alert and oriented to person, place, and time. He has normal strength. No cranial nerve deficit or sensory deficit.  Skin: Skin is warm and dry.  Psychiatric: He has a normal mood and affect. His speech is normal.  Nursing note and vitals reviewed.    ED Treatments / Results  Labs (all labs ordered are listed, but only abnormal results are displayed) Labs Reviewed - No data to display  EKG None  Radiology No results found.  Procedures Procedures (including critical care time)  Medications Ordered in ED Medications - No data to display   Initial Impression / Assessment and Plan / ED Course  I have reviewed the triage vital signs and the nursing notes.  Pertinent labs & imaging results that were available during my care of the patient were reviewed by me and considered in my medical decision making (see chart for details).       Final Clinical Impressions(s) / ED Diagnoses MDM The vital signs are within normal limits.  The pulse oximetry is 97% on room air.  Within normal limits by my interpretation.  The patient has a ruptured abscess of the left axilla.  There is some increased redness present.  No red streaks noted.  There is no palpable lymph nodes in the area.  And the area is not hot to touch.  I have asked the patient to use warm Epson salt soaks daily until the wound heals.  I have asked him to use doxycycline 2 times  daily with food.  I also asked him to use ibuprofen 3 times daily for inflammation and discomfort.  The patient is to be followed by Dr. Ouida Sills in the office in about 3 days to ensure that this infection is not getting worse.  Patient is in agreement with this plan.   Final diagnoses:  Abscess of left axilla    ED Discharge Orders        Ordered    doxycycline (VIBRAMYCIN) 100 MG capsule  2 times daily     01/08/18 2119    ibuprofen (ADVIL,MOTRIN) 600 MG tablet  3  times daily     01/08/18 2119       Ivery QualeBryant, Quinlyn Tep, PA-C 01/08/18 2121    Donnetta Hutchingook, Brian, MD 01/09/18 1745

## 2018-01-08 NOTE — ED Notes (Signed)
ED Provider at bedside. 

## 2018-01-08 NOTE — Discharge Instructions (Signed)
You have an abscess of the left underarm area.  Please keep the area clean and dry.  Please soak in warm Epson salt soak for about 15 minutes daily.  After drying thoroughly, apply a Band-Aid to the area.  Please use doxycycline 2 times daily.  Please use ibuprofen 3 times daily for inflammation and also to help with soreness and discomfort.  Please call Dr. Ouida SillsFagan on tomorrow and arrange an appointment in about 3 days to ensure that this infected area is improving.  Please see Dr. Ouida SillsFagan or return to the emergency department if any red streaks going up your arm, excessive pus like drainage from the abscess area, unusual swelling of your underarm or your arm, or temperature elevations that will not respond to Tylenol and ibuprofen.

## 2018-01-08 NOTE — ED Triage Notes (Signed)
Pt reports "something bit him under left axilla". Today he squeezed it and white exudate came out. Raised area, center has small opening with exudate. Skin surrounding is red and edematous.

## 2018-01-12 DIAGNOSIS — Z6821 Body mass index (BMI) 21.0-21.9, adult: Secondary | ICD-10-CM | POA: Diagnosis not present

## 2018-01-12 DIAGNOSIS — L02422 Furuncle of left axilla: Secondary | ICD-10-CM | POA: Diagnosis not present

## 2018-01-15 DIAGNOSIS — Z0001 Encounter for general adult medical examination with abnormal findings: Secondary | ICD-10-CM | POA: Diagnosis not present

## 2018-01-22 DIAGNOSIS — Z6821 Body mass index (BMI) 21.0-21.9, adult: Secondary | ICD-10-CM | POA: Diagnosis not present

## 2018-01-22 DIAGNOSIS — M7711 Lateral epicondylitis, right elbow: Secondary | ICD-10-CM | POA: Diagnosis not present

## 2018-01-22 DIAGNOSIS — Z0000 Encounter for general adult medical examination without abnormal findings: Secondary | ICD-10-CM | POA: Diagnosis not present

## 2018-01-22 DIAGNOSIS — S81811A Laceration without foreign body, right lower leg, initial encounter: Secondary | ICD-10-CM | POA: Diagnosis not present

## 2019-01-30 ENCOUNTER — Ambulatory Visit
Admission: EM | Admit: 2019-01-30 | Discharge: 2019-01-30 | Disposition: A | Discharge status: Home / Self Care | Payer: BC Managed Care – PPO | Attending: Emergency Medicine | Admitting: Emergency Medicine

## 2019-01-30 ENCOUNTER — Other Ambulatory Visit: Payer: Self-pay

## 2019-01-30 DIAGNOSIS — M545 Low back pain, unspecified: Secondary | ICD-10-CM

## 2019-01-30 DIAGNOSIS — M62830 Muscle spasm of back: Secondary | ICD-10-CM | POA: Diagnosis not present

## 2019-01-30 DIAGNOSIS — S39012A Strain of muscle, fascia and tendon of lower back, initial encounter: Secondary | ICD-10-CM

## 2019-01-30 MED ORDER — CYCLOBENZAPRINE HCL 10 MG PO TABS: 10.0000 mg | ORAL_TABLET | Freq: Every day | ORAL | 0 refills | Status: DC

## 2019-01-30 MED ORDER — PREDNISONE 20 MG PO TABS: 20.0000 mg | ORAL_TABLET | Freq: Two times a day (BID) | ORAL | 0 refills | Status: AC

## 2019-01-30 MED ORDER — KETOROLAC TROMETHAMINE 60 MG/2ML IM SOLN
60.0000 mg | Freq: Once | INTRAMUSCULAR | Status: AC
  Administered 2019-01-30: 60 mg via INTRAMUSCULAR

## 2019-01-30 NOTE — ED Triage Notes (Signed)
Pt was working on deck last week and strained back, pain has worsened and is radiating down left leg

## 2019-01-30 NOTE — Discharge Instructions (Addendum)
Toradol shot given in office Continue conservative management of rest, ice, heat, and gentle stretches/ massage Prednisone prescribed.  Take as directed and to completion Take cyclobenzaprine at nighttime for symptomatic relief. Avoid driving or operating heavy machinery while using medication. Follow up with PCP if symptoms persist Return or go to the ER if you have any new or worsening symptoms (fever, chills, chest pain, abdominal pain, changes in bowel or bladder habits, pain radiating into lower legs, symptoms do not improve with medications, etc...)

## 2019-01-30 NOTE — ED Provider Notes (Signed)
[facility]   [id number] 01/30/19 Arrival Time: 1026  CC: Back pain  SUBJECTIVE: History from: patient. Jacob Richardson is a 48 y.o. male complains of low back pain that began last week.  States he strained his back after bending over to pick up a nail at work.  Localizes the pain to the low back.  Describes the pain as constant and sharp in character.  Pain is 5/10.  Has tried muscle relaxer without relief.  Symptoms are made worse with leaning back.  Denies similar symptoms in the past.  Denies radiating symptoms.  Denies fever, chills, chest pain, SOB, erythema, ecchymosis, effusion, weakness, numbness and tingling, saddle paresthesias, loss of bowel or bladder function.      ROS: As per HPI.  All other pertinent ROS negative.     Past Medical History:  Diagnosis Date  . Dysrhythmia 12/30/2012   bradycardia    . Syncope and collapse 12/30/2012   Past Surgical History:  Procedure Laterality Date  . ELBOW SURGERY     Allergies  Allergen Reactions  . Codeine Nausea And Vomiting and Other (See Comments)    Passes out   No current facility-administered medications on file prior to encounter.    Current Outpatient Medications on File Prior to Encounter  Medication Sig Dispense Refill  . ibuprofen (ADVIL,MOTRIN) 600 MG tablet Take 1 tablet (600 mg total) by mouth 3 (three) times daily. 21 tablet 0  . naproxen (NAPROSYN) 250 MG tablet Take 1 po BID with food prn pain 20 tablet 0   Social History   Socioeconomic History  . Marital status: Married    Spouse name: Not on file  . Number of children: Not on file  . Years of education: Not on file  . Highest education level: Not on file  Occupational History  . Not on file  Social Needs  . Financial resource strain: Not on file  . Food insecurity    Worry: Not on file    Inability: Not on file  . Transportation needs    Medical: Not on file    Non-medical: Not on file  Tobacco Use  . Smoking status: Current Every Day  Smoker    Packs/day: 1.00    Years: 25.00    Pack years: 25.00    Types: Cigarettes  . Smokeless tobacco: Never Used  Substance and Sexual Activity  . Alcohol use: No  . Drug use: Yes    Types: Marijuana  . Sexual activity: Not on file  Lifestyle  . Physical activity    Days per week: Not on file    Minutes per session: Not on file  . Stress: Not on file  Relationships  . Social Musicianconnections    Talks on phone: Not on file    Gets together: Not on file    Attends religious service: Not on file    Active member of club or organization: Not on file    Attends meetings of clubs or organizations: Not on file    Relationship status: Not on file  . Intimate partner violence    Fear of current or ex partner: Not on file    Emotionally abused: Not on file    Physically abused: Not on file    Forced sexual activity: Not on file  Other Topics Concern  . Not on file  Social History Narrative  . Not on file   History reviewed. No pertinent family history.  OBJECTIVE:  Vitals:   01/30/19  1041  BP: 139/83  Pulse: 74  Resp: 18  Temp: 98.1 F (36.7 C)  SpO2: 96%    General appearance: ALERT; appears uncomfortable, in no acute distress.  Head: NCAT Lungs: Normal respiratory effort; CTAB CV: RRR Musculoskeletal: Back Inspection: Skin warm, dry, clear and intact without obvious erythema, effusion, or ecchymosis.  Palpation: diffusely TTP over lower back, no midline tenderness ROM: LROM about the lumbar spine Strength: 5/5 shld abduction, 5/5 shld adduction, 5/5 elbow flexion, 5/5 elbow extension, 5/5 grip strength, 5/5 hip flexion, 5/5 knee abduction, 5/5 knee adduction, 5/5 knee flexion, 5/5 knee extension, 5/5 dorsiflexion, 5/5 plantar flexion Skin: warm and dry Neurologic: Ambulates without difficulty; Sensation intact about the upper/ lower extremities Psychological: alert and cooperative; normal mood and affect  ASSESSMENT & PLAN:  1. Acute bilateral low back pain  without sciatica   2. Strain of muscle, fascia and tendon of lower back, initial encounter   3. Back muscle spasm    Meds ordered this encounter  Medications  . ketorolac (TORADOL) injection 60 mg  . predniSONE (DELTASONE) 20 MG tablet    Sig: Take 1 tablet (20 mg total) by mouth 2 (two) times daily with a meal for 5 days.    Dispense:  10 tablet    Refill:  0    Order Specific Question:   Supervising Provider    Answer:   Raylene Everts [[id number]]  . cyclobenzaprine (FLEXERIL) 10 MG tablet    Sig: Take 1 tablet (10 mg total) by mouth at bedtime.    Dispense:  15 tablet    Refill:  0    Order Specific Question:   Supervising Provider    Answer:   Raylene Everts [[id number]]   Toradol shot given in office Continue conservative management of rest, ice, heat, and gentle stretches/ massage Prednisone prescribed.  Take as directed and to completion Take cyclobenzaprine at nighttime for symptomatic relief. Avoid driving or operating heavy machinery while using medication. Follow up with PCP if symptoms persist Return or go to the ER if you have any new or worsening symptoms (fever, chills, chest pain, abdominal pain, changes in bowel or bladder habits, pain radiating into lower legs, symptoms do not improve with medications, etc...)   Reviewed expectations re: course of current medical issues. Questions answered. Outlined signs and symptoms indicating need for more acute intervention. Patient verbalized understanding. After Visit Summary given.    Lestine Box, PA-C 01/30/19 1212

## 2019-02-22 DIAGNOSIS — M5442 Lumbago with sciatica, left side: Secondary | ICD-10-CM | POA: Diagnosis not present

## 2019-02-22 DIAGNOSIS — M9902 Segmental and somatic dysfunction of thoracic region: Secondary | ICD-10-CM | POA: Diagnosis not present

## 2019-02-22 DIAGNOSIS — M9903 Segmental and somatic dysfunction of lumbar region: Secondary | ICD-10-CM | POA: Diagnosis not present

## 2019-02-22 DIAGNOSIS — M9905 Segmental and somatic dysfunction of pelvic region: Secondary | ICD-10-CM | POA: Diagnosis not present

## 2019-03-01 DIAGNOSIS — M9903 Segmental and somatic dysfunction of lumbar region: Secondary | ICD-10-CM | POA: Diagnosis not present

## 2019-03-01 DIAGNOSIS — M9905 Segmental and somatic dysfunction of pelvic region: Secondary | ICD-10-CM | POA: Diagnosis not present

## 2019-03-01 DIAGNOSIS — M5442 Lumbago with sciatica, left side: Secondary | ICD-10-CM | POA: Diagnosis not present

## 2019-03-01 DIAGNOSIS — M9902 Segmental and somatic dysfunction of thoracic region: Secondary | ICD-10-CM | POA: Diagnosis not present

## 2019-03-04 DIAGNOSIS — M5442 Lumbago with sciatica, left side: Secondary | ICD-10-CM | POA: Diagnosis not present

## 2019-03-04 DIAGNOSIS — M9905 Segmental and somatic dysfunction of pelvic region: Secondary | ICD-10-CM | POA: Diagnosis not present

## 2019-03-04 DIAGNOSIS — M9902 Segmental and somatic dysfunction of thoracic region: Secondary | ICD-10-CM | POA: Diagnosis not present

## 2019-03-04 DIAGNOSIS — M9903 Segmental and somatic dysfunction of lumbar region: Secondary | ICD-10-CM | POA: Diagnosis not present

## 2019-03-08 DIAGNOSIS — M9905 Segmental and somatic dysfunction of pelvic region: Secondary | ICD-10-CM | POA: Diagnosis not present

## 2019-03-08 DIAGNOSIS — M9903 Segmental and somatic dysfunction of lumbar region: Secondary | ICD-10-CM | POA: Diagnosis not present

## 2019-03-08 DIAGNOSIS — M9902 Segmental and somatic dysfunction of thoracic region: Secondary | ICD-10-CM | POA: Diagnosis not present

## 2019-03-08 DIAGNOSIS — M5442 Lumbago with sciatica, left side: Secondary | ICD-10-CM | POA: Diagnosis not present

## 2019-09-06 ENCOUNTER — Ambulatory Visit: Payer: Self-pay

## 2019-09-06 ENCOUNTER — Other Ambulatory Visit: Payer: Self-pay

## 2019-09-06 ENCOUNTER — Ambulatory Visit (INDEPENDENT_AMBULATORY_CARE_PROVIDER_SITE_OTHER): Payer: 59

## 2019-09-06 ENCOUNTER — Ambulatory Visit: Payer: 59 | Admitting: Orthopedic Surgery

## 2019-09-06 ENCOUNTER — Encounter: Payer: Self-pay | Admitting: Orthopedic Surgery

## 2019-09-06 DIAGNOSIS — M25521 Pain in right elbow: Secondary | ICD-10-CM

## 2019-09-06 DIAGNOSIS — M7711 Lateral epicondylitis, right elbow: Secondary | ICD-10-CM | POA: Diagnosis not present

## 2019-09-06 DIAGNOSIS — M25522 Pain in left elbow: Secondary | ICD-10-CM

## 2019-09-06 DIAGNOSIS — M7712 Lateral epicondylitis, left elbow: Secondary | ICD-10-CM

## 2019-09-06 MED ORDER — MELOXICAM 15 MG PO TABS: 15.0000 mg | ORAL_TABLET | Freq: Every day | ORAL | 0 refills | Status: DC

## 2019-09-06 NOTE — Progress Notes (Signed)
Office Visit Note   Patient: Jacob Richardson           Date of Birth: Dec 04, 1970           MRN: [id number] Visit Date: 09/06/2019 Requested by: Carylon Perches, MD [address] [place],  [place] [address] PCP: Carylon Perches, MD  Subjective: Chief Complaint  Patient presents with  . Elbow Pain    HPI: Jacob Richardson is a 49 y.o. male who presents to the office complaining of bilateral elbow pain.  Patient is a right-hand-dominant male who has been experiencing 2 to 3 years of bilateral elbow pain.  Initially his right elbow was causing him more pain but recently the left elbow has been more symptomatic.  He denies any acute injury.  He works in [employer].  He notes pain that he localizes over the lateral condyle and associated with heavy grip.  Denies any neck pain.  He does note numbness and tingling in the left travels from the left elbow into all 5 fingertips.  Denies any weakness.  Does not take any medication or use any sort of brace.  He does have a history of one left elbow surgery for "a torn ligament" was about 20 years ago..                ROS:  All systems reviewed are negative as they relate to the chief complaint within the history of present illness.  Patient denies fevers or chills.  Assessment & Plan: Visit Diagnoses:  1. Bilateral tennis elbow   2. Bilateral elbow joint pain     Plan: Patient is a 49 year old male who presents complaining of bilateral elbow pain.  Localizes pain over the lateral epicondyle bilaterally.  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  He does not take any medication or has tried any interventions yet.  Impression is bilateral tennis elbow.  Regarding the numbness/tingling, this is relatively new onset in the last few weeks and does not cause him significant distress.  Plan for trial of meloxicam with a tennis elbow brace on the left more symptomatic elbow.  Follow-up in 8 weeks for clinical recheck.  If this provides  no relief we may consider PRP injection.  Radiographs taken today reveal no significant degenerative changes aside from a slightly larger bone spur of the left elbow lateral epicondyle.  Follow-Up Instructions: No follow-ups on file.   Orders:  Orders Placed This Encounter  Procedures  . XR Elbow 2 Views Right  . XR Elbow 2 Views Left   Meds ordered this encounter  Medications  . meloxicam (MOBIC) 15 MG tablet    Sig: Take 1 tablet (15 mg total) by mouth daily.    Dispense:  30 tablet    Refill:  0      Procedures: No procedures performed   Clinical Data: No additional findings.  Objective: Vital Signs: There were no vitals taken for this visit.  Physical Exam:  Constitutional: Patient appears well-developed HEENT:  Head: Normocephalic Eyes:EOM are normal Neck: Normal range of motion Cardiovascular: Normal rate Pulmonary/chest: Effort normal Neurologic: Patient is alert Skin: Skin is warm Psychiatric: Patient has normal mood and affect  Ortho Exam:  Bilateral elbow exam Tender to palpation over the lateral epicondyle.  Right elbow lateral epicondyle pain is elicited with resisted wrist extension.  Left elbow lateral epicondyle pain is elicited with heavy grip strength. No tenderness to palpation over the medial epicondyle, olecranon,  biceps tendon bilaterally.  Biceps tendon is intact bilaterally.  Full range of motion of the elbow.  No change in elbow pain on the right side with grip, supination, pronation.  No change in elbow pain on the left with wrist extension, pronation, supination.  Specialty Comments:  No specialty comments available.  Imaging: No results found.   PMFS History: Patient Active Problem List   Diagnosis Date Noted  . Bradycardia 01/01/2013  . Nicotine addiction 12/31/2012  . Syncope 12/31/2012   Past Medical History:  Diagnosis Date  . Dysrhythmia 12/30/2012   bradycardia    . Syncope and collapse 12/30/2012    History reviewed. No  pertinent family history.  Past Surgical History:  Procedure Laterality Date  . ELBOW SURGERY     Social History   Occupational History  . Not on file  Tobacco Use  . Smoking status: Current Every Day Smoker    Packs/day: 1.00    Years: 25.00    Pack years: 25.00    Types: Cigarettes  . Smokeless tobacco: Never Used  Substance and Sexual Activity  . Alcohol use: No  . Drug use: Yes    Types: Marijuana  . Sexual activity: Not on file

## 2019-09-09 ENCOUNTER — Encounter: Payer: Self-pay | Admitting: Orthopedic Surgery

## 2020-12-15 ENCOUNTER — Ambulatory Visit
Admission: EM | Admit: 2020-12-15 | Discharge: 2020-12-15 | Disposition: A | Discharge status: Home / Self Care | Payer: 59 | Attending: Family Medicine | Admitting: Family Medicine

## 2020-12-15 ENCOUNTER — Emergency Department (HOSPITAL_COMMUNITY)
Admission: EM | Admit: 2020-12-15 | Discharge: 2020-12-15 | Disposition: A | Discharge status: Home / Self Care | Payer: 59 | Attending: Emergency Medicine | Admitting: Emergency Medicine

## 2020-12-15 ENCOUNTER — Encounter (HOSPITAL_COMMUNITY): Payer: Self-pay | Admitting: Emergency Medicine

## 2020-12-15 ENCOUNTER — Other Ambulatory Visit: Payer: Self-pay

## 2020-12-15 ENCOUNTER — Encounter: Payer: Self-pay | Admitting: Emergency Medicine

## 2020-12-15 DIAGNOSIS — G8929 Other chronic pain: Secondary | ICD-10-CM | POA: Diagnosis not present

## 2020-12-15 DIAGNOSIS — M545 Low back pain, unspecified: Secondary | ICD-10-CM

## 2020-12-15 DIAGNOSIS — Y9269 Other specified industrial and construction area as the place of occurrence of the external cause: Secondary | ICD-10-CM | POA: Insufficient documentation

## 2020-12-15 DIAGNOSIS — Z5321 Procedure and treatment not carried out due to patient leaving prior to being seen by health care provider: Secondary | ICD-10-CM | POA: Diagnosis not present

## 2020-12-15 DIAGNOSIS — Y990 Civilian activity done for income or pay: Secondary | ICD-10-CM | POA: Diagnosis not present

## 2020-12-15 DIAGNOSIS — X500XXA Overexertion from strenuous movement or load, initial encounter: Secondary | ICD-10-CM | POA: Insufficient documentation

## 2020-12-15 MED ORDER — DICLOFENAC SODIUM 75 MG PO TBEC: 75.0000 mg | DELAYED_RELEASE_TABLET | Freq: Two times a day (BID) | ORAL | 0 refills | Status: AC

## 2020-12-15 MED ORDER — DEXAMETHASONE SODIUM PHOSPHATE 10 MG/ML IJ SOLN
10.0000 mg | Freq: Once | INTRAMUSCULAR | Status: AC
  Administered 2020-12-15: 10 mg via INTRAMUSCULAR

## 2020-12-15 MED ORDER — CYCLOBENZAPRINE HCL 10 MG PO TABS: ORAL_TABLET | ORAL | 0 refills | Status: AC

## 2020-12-15 NOTE — ED Triage Notes (Signed)
Pt to the ED with c/o back pain for the past week.

## 2020-12-15 NOTE — ED Provider Notes (Signed)
Emergency Medicine Provider Triage Evaluation Note  Jacob Richardson , a 50 y.o. male  was evaluated in triage.  Pt complains of low back pain x 1 week.  Denies specific injury but has been lifting heavy doors as he works in [employer].  Has been seen by chiropractor this week without improvement in his pain.  His pain is right-sided and radiates upward towards his neck with certain positional changes.  Review of Systems  Positive: Low back pain Negative: No lower extremity weakness, numbness or pain.  No urinary or fecal incontinence or retention  Physical Exam  BP 127/77 (BP Location: Right Arm)   Pulse 79   Temp 98.5 F (36.9 C) (Oral)   Resp 18   Ht 5\' 7"  (1.702 m)   Wt 62.6 kg   SpO2 99%   BMI 21.61 kg/m  Gen:   Awake, no distress   Resp:  Normal effort  MSK:   Moves extremities without difficulty  Other:  No focal obvious weakness of lower extremities  Medical Decision Making  Medically screening exam initiated at 1:18 PM.  Appropriate orders placed.  Jacob Richardson was informed that the remainder of the evaluation will be completed by another provider, this initial triage assessment does not replace that evaluation, and the importance of remaining in the ED until their evaluation is complete.  Pt with acute on chronic low back pain. No indication per history for imaging at this time.     Emilie Rutter, PA-C 12/15/20 1323    12/17/20, MD 12/17/20 1556

## 2020-12-15 NOTE — ED Triage Notes (Signed)
Low back pain x 2-3 weeks.  States it has been hurting since he hung several doors at work.  Has seen a chiropractor with no relief.

## 2020-12-15 NOTE — ED Provider Notes (Signed)
[facility]   [phone] 12/15/20 Arrival Time: 1403  ASSESSMENT & PLAN:  1. Acute right-sided low back pain without sciatica    Able to ambulate here and hemodynamically stable. No indication for imaging of back at this time given no trauma and normal neurological exam. Discussed.  Treatment: Meds ordered this encounter  Medications   dexamethasone (DECADRON) injection 10 mg   diclofenac (VOLTAREN) 75 MG EC tablet    Sig: Take 1 tablet (75 mg total) by mouth 2 (two) times daily.    Dispense:  14 tablet    Refill:  0   cyclobenzaprine (FLEXERIL) 10 MG tablet    Sig: Take 1 tablet by mouth 3 times daily as needed for muscle spasm. Warning: May cause drowsiness.    Dispense:  21 tablet    Refill:  0    Medication sedation precautions given. Encourage ROM/movement as tolerated.  Recommend:  Follow-up Information     Carylon Perches, MD.   Specialty: Internal Medicine Why: As needed. Contact information: [address] [place] [address] [phone]                 Reviewed expectations re: course of current medical issues. Questions answered. Outlined signs and symptoms indicating need for more acute intervention. Patient verbalized understanding. After Visit Summary given.   SUBJECTIVE: History from: patient. Jacob Richardson is a 50 y.o. male who presents with complaint of fairly persistent right sided lower back discomfort. Onset gradual. First noted  3 w ago . Injury/trama: reports pain s/p hanging heavy doors at work; sent to chiropractor; no help. History of back problems requiring medical care: rare. Pain described as aching and without radiation. Aggravating factors: certain movements and prolonged walking/standing. Alleviating factors: rest. Progressive LE weakness or saddle anesthesia: none. Extremity sensation changes or weakness: none. Ambulatory without difficulty. Normal bowel/bladder habits: yes; without urinary retention. Normal PO  intake without n/v. No associated abdominal pain/n/v. Self treatment: has has not tried OTC therapies.  OBJECTIVE:  Vitals:   12/15/20 1518  BP: 138/69  Pulse: 98  Resp: 17  Temp: 98.4 F (36.9 C)  SpO2: 94%    General appearance: alert; no distress HEENT: [place]; AT Neck: supple with FROM; without midline tenderness CV: regular Lungs: unlabored respirations; speaks full sentences without difficulty Abdomen: soft, non-tender; non-distended Back: moderate and poorly localized tenderness to palpation over R lumbar paraspinal musculature ; FROM at waist; bruising: none; without midline tenderness Extremities: without edema; symmetrical without gross deformities; normal ROM of bilateral LE Skin: warm and dry Neurologic: normal gait; normal sensation and strength of bilateral LE Psychological: alert and cooperative; normal mood and affect    Allergies  Allergen Reactions   Codeine Nausea And Vomiting and Other (See Comments)    Passes out    Past Medical History:  Diagnosis Date   Dysrhythmia 12/30/2012   bradycardia     Syncope and collapse 12/30/2012   Social History   Socioeconomic History   Marital status: Married    Spouse name: Not on file   Number of children: Not on file   Years of education: Not on file   Highest education level: Not on file  Occupational History   Not on file  Tobacco Use   Smoking status: Every Day    Packs/day: 1.00    Years: 25.00    Pack years: 25.00    Types: Cigarettes   Smokeless tobacco: Never  Vaping Use   Vaping Use: Never used  Substance and Sexual Activity   Alcohol use: No   Drug use: Yes    Frequency: 2.0 times per week    Types: Marijuana   Sexual activity: Not on file  Other Topics Concern   Not on file  Social History Narrative   Not on file   Social Determinants of Health   Financial Resource Strain: Not on file  Food Insecurity: Not on file  Transportation Needs: Not on file  Physical Activity: Not on file   Stress: Not on file  Social Connections: Not on file  Intimate Partner Violence: Not on file   No family history on file. Past Surgical History:  Procedure Laterality Date   ELBOW SURGERY        Mardella Layman, MD 12/15/20 [phone]

## 2020-12-15 NOTE — Discharge Instructions (Addendum)
Meds ordered this encounter  Medications   dexamethasone (DECADRON) injection 10 mg   diclofenac (VOLTAREN) 75 MG EC tablet    Sig: Take 1 tablet (75 mg total) by mouth 2 (two) times daily.    Dispense:  14 tablet    Refill:  0   cyclobenzaprine (FLEXERIL) 10 MG tablet    Sig: Take 1 tablet by mouth 3 times daily as needed for muscle spasm. Warning: May cause drowsiness.    Dispense:  21 tablet    Refill:  0   HOME CARE INSTRUCTIONS: For many people, back pain returns. Since low back pain is rarely dangerous, it is often a condition that people can learn to manage on their own. Please remain active. It is stressful on the back to sit or stand in one place. Do not sit, drive, or stand in one place for more than 30 minutes at a time. Take short walks on level surfaces as soon as pain allows. Try to increase the length of time you walk each day. Do not stay in bed. Resting more than 1 or 2 days can delay your recovery. Do not avoid exercise or work. Your body is made to move. It is not dangerous to be active, even though your back may hurt. Your back will likely heal faster if you return to being active before your pain is gone. Over-the-counter medicines to reduce pain and inflammation are often the most helpful.  SEEK MEDICAL CARE IF: You have pain that is not relieved with rest or medicine. You have pain that does not improve in 1 week. You have new symptoms. You are generally not feeling well.  SEEK IMMEDIATE MEDICAL CARE IF: You have pain that radiates from your back into your legs. You develop new bowel or bladder control problems. You have unusual weakness or numbness in your arms or legs. You develop nausea or vomiting. You develop abdominal pain. You feel faint.

## 2020-12-30 ENCOUNTER — Other Ambulatory Visit: Payer: Self-pay

## 2020-12-30 ENCOUNTER — Ambulatory Visit (INDEPENDENT_AMBULATORY_CARE_PROVIDER_SITE_OTHER): Payer: 59 | Admitting: Orthopaedic Surgery

## 2020-12-30 ENCOUNTER — Ambulatory Visit (INDEPENDENT_AMBULATORY_CARE_PROVIDER_SITE_OTHER): Payer: 59

## 2020-12-30 ENCOUNTER — Encounter: Payer: Self-pay | Admitting: Orthopaedic Surgery

## 2020-12-30 VITALS — Ht 66.0 in | Wt 138.0 lb

## 2020-12-30 DIAGNOSIS — M542 Cervicalgia: Secondary | ICD-10-CM | POA: Diagnosis not present

## 2020-12-30 DIAGNOSIS — M545 Low back pain, unspecified: Secondary | ICD-10-CM | POA: Insufficient documentation

## 2020-12-30 DIAGNOSIS — G8929 Other chronic pain: Secondary | ICD-10-CM

## 2020-12-30 NOTE — Progress Notes (Signed)
Office Visit Note   Patient: Jacob Richardson           Date of Birth: 1970/09/15           MRN: [id number] Visit Date: 12/30/2020              Requested by: Carylon Perches, MD [address] [place],  [place] [address] PCP: Carylon Perches, MD   Assessment & Plan: Visit Diagnoses:  1. Neck pain   2. Acute midline low back pain without sciatica   3. Chronic bilateral low back pain without sciatica     Plan: Mr. May is 50 years old accompanied by his wife and is seen for evaluation of neck and back pain.  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  He had acute onset of neck pain without obvious injury.  He went to the chiropractor and eventually to the urgent care.  He did not have any x-rays but was given some exercises and at the urgent care was given a muscle relaxant and a cortisone shot.  He notes that he is better than he was.  He is never had back or neck surgery he does smoke.  He is not had any upper or lower extremity radiculopathy and no bowel or bladder changes.  He denies any headaches.  He was away from work for short time but notes he has been back to work.  He thinks he is fine to work.  His exam was relatively benign and was neurologically intact.  He actually had good range of motion and negative straight leg raise.  Good strength in both upper extremities.  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  I discussed this in detail with him and reviewed the films with him.  He should work on strengthening exercises and take over-the-counter medicines as necessary.  Have also urged him not to smoke there is no evidence of radiculopathy.  We will plan to see him back anytime in the future.  I think is fine to work  Follow-Up Instructions: Return if symptoms worsen or fail to improve.   Orders:  Orders Placed This Encounter  Procedures   XR Cervical Spine 2 or 3 views   XR Lumbar Spine 2-3 Views   No orders of the defined types  were placed in this encounter.     Procedures: No procedures performed   Clinical Data: No additional findings.   Subjective: Chief Complaint  Patient presents with   Neck - Pain   Lower Back - Pain  Patient presents today for neck and lower back pain. He said that his pain started while at work two weeks hanging doors. He went to a chiropractor and also to an urgent care. He has not had any x-rays yet. He said that he has no pain, numbness, or tingling in any of his extremities. No previous back surgery. Not taking anything for pain.  At the urgent care he was given a cortisone injection and muscle relaxant.  He feels much better.  He is not had any referred pain to either upper or lower extremity.  He is not had any headaches.  Denies any bowel or bladder changes.  He has had a few aches and pains in the past but nothing to this extent.  He is much better since his visit to the urgent care but felt like he needed to be evaluated before he had any further  recurrences.  HPI  Review of Systems   Objective: Vital Signs: Ht 5\' 6"  (1.676 m)   Wt 138 lb (62.6 kg)   BMI 22.27 kg/m   Physical Exam Constitutional:      Appearance: He is well-developed.  Eyes:     Pupils: Pupils are equal, round, and reactive to light.  Pulmonary:     Effort: Pulmonary effort is normal.  Skin:    General: Skin is warm and dry.  Neurological:     Mental Status: He is alert and oriented to person, place, and time.  Psychiatric:        Behavior: Behavior normal.    Ortho Exam awake alert and oriented x3.  Comfortable sitting and in no acute distress.  Able to touch his chin to his chest and had just about full neck extension with no referred pain to either upper extremity or shoulders.  No masses.  Had a little loss of rotation to the right with some right-sided neck pain but not to the left.  Good grip and release and reflexes were symmetrical.  Straight leg raise negative.  Painless range of  motion both hips.  No percussible tenderness of lumbar spine or sacrum.  Motor exam intact.  Specialty Comments:  No specialty comments available.  Imaging: XR Cervical Spine 2 or 3 views  Result Date: 12/30/2020 Films of the cervical spine were obtained in 2 projections.  There is straightening of the normal cervical lordosis with degenerative disc disease at C5-6 and C6-7 at those levels there is disc base narrowing and anterior osteophytes.  No listhesis.  Facet sclerosis also identified at C5-6 and C6-7.  No acute changes  XR Lumbar Spine 2-3 Views  Result Date: 12/30/2020 Films of the lumbar spine were obtained in 2 projections.  No evidence of an anterior listhesis or scoliosis.  There is degenerative disc disease at L5-S1 with narrowing.  There is also diffuse calcification of the abdominal aorta.  No acute changes or obvious bony abnormalities    PMFS History: Patient Active Problem List   Diagnosis Date Noted   Low back pain 12/30/2020   Neck pain 12/30/2020   Bradycardia 01/01/2013   Nicotine addiction 12/31/2012   Syncope 12/31/2012   Past Medical History:  Diagnosis Date   Dysrhythmia 12/30/2012   bradycardia     Syncope and collapse 12/30/2012    History reviewed. No pertinent family history.  Past Surgical History:  Procedure Laterality Date   ELBOW SURGERY     Social History   Occupational History   Not on file  Tobacco Use   Smoking status: Every Day    Packs/day: 1.00    Years: 25.00    Pack years: 25.00    Types: Cigarettes   Smokeless tobacco: Never  Vaping Use   Vaping Use: Never used  Substance and Sexual Activity   Alcohol use: No   Drug use: Yes    Frequency: 2.0 times per week    Types: Marijuana   Sexual activity: Not on file

## 2021-01-04 ENCOUNTER — Encounter: Payer: Self-pay | Admitting: Orthopaedic Surgery

## 2021-01-05 ENCOUNTER — Other Ambulatory Visit: Payer: Self-pay

## 2021-01-05 MED ORDER — METHOCARBAMOL 500 MG PO TABS: ORAL_TABLET | ORAL | 0 refills | Status: AC

## 2021-01-05 NOTE — Telephone Encounter (Signed)
Robaxin 500 mg 1 tab po tid prn. If no better next week will need to be seen in office-thanks

## 2021-01-25 NOTE — Telephone Encounter (Signed)
noted 

## 2021-02-01 ENCOUNTER — Other Ambulatory Visit: Payer: Self-pay

## 2021-02-01 DIAGNOSIS — M542 Cervicalgia: Secondary | ICD-10-CM

## 2021-02-01 NOTE — Telephone Encounter (Signed)
Not familiar with either Dr Petra Kuba or Dr Bunnie Pion with Mr Hayter as to whom he would like to be referred

## 2021-04-09 ENCOUNTER — Encounter (HOSPITAL_COMMUNITY): Payer: Self-pay | Admitting: Physical Therapy

## 2021-04-09 ENCOUNTER — Other Ambulatory Visit: Payer: Self-pay

## 2021-04-09 ENCOUNTER — Ambulatory Visit (HOSPITAL_COMMUNITY): Payer: 59 | Attending: Orthopaedic Surgery | Admitting: Physical Therapy

## 2021-04-09 DIAGNOSIS — M25611 Stiffness of right shoulder, not elsewhere classified: Secondary | ICD-10-CM | POA: Diagnosis present

## 2021-04-09 DIAGNOSIS — M6281 Muscle weakness (generalized): Secondary | ICD-10-CM | POA: Insufficient documentation

## 2021-04-09 DIAGNOSIS — M542 Cervicalgia: Secondary | ICD-10-CM | POA: Diagnosis not present

## 2021-04-09 NOTE — Therapy (Addendum)
[place], [place], [address] Phone: [phone]   Fax:  [phone]  Physical Therapy Evaluation and Discharge Note  Patient Details  Name: Jacob Richardson MRN: [id number] Date of Birth: [birth year] Referring Provider (PT): Joni Fears MD  PHYSICAL THERAPY DISCHARGE SUMMARY  Visits from Start of Care: 1  Current functional level related to goals / functional outcomes: Unable to assess due to unplanned discharge    Remaining deficits: Unable to assess due to unplanned discharge    Education / Equipment: Unable to assess due to unplanned discharge    Patient agrees to discharge. Patient goals were not met. Patient is being discharged due to not returning since the last visit.  8:19 AM, 07/01/21 Jerene Pitch, DPT Physical Therapy with [facility]  [phone] office    Encounter Date: 04/09/2021   PT End of Session - 04/09/21 0845     Visit Number 1    Number of Visits 8    Date for PT Re-Evaluation 05/07/21    Authorization Type [employer] VL 30 PT/OT/Chiro 0 used no auth    Authorization - Visit Number 1    Authorization - Number of Visits 30    PT Start Time [id number]    PT Stop Time 0907    PT Time Calculation (min) 35 min    Activity Tolerance Patient tolerated treatment well    Behavior During Therapy [facility] for tasks assessed/performed             Past Medical History:  Diagnosis Date   Dysrhythmia 12/30/2012   bradycardia     Syncope and collapse 12/30/2012    Past Surgical History:  Procedure Laterality Date   ELBOW SURGERY      There were no vitals filed for this visit.    Subjective Assessment - 04/09/21 0841     Subjective Reports that he has been having neck pain in the right side of his neck especially looking up. Reports that he has bone spurs on xray. States that they have to do PT to get MRI. States that he is following up with Dr. Brigitte Pulse for pain  management and then will follow up with Dr. Katherine Roan. November 7th he has an apt for an injection with in his neck with Dr. Brigitte Pulse. 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 States that he has difficulty reaching up. States that current pain level 4/10 and is 8/10 at the end of the day. Muscle relaxers help with pain. Looking to the right and up increase his pain. States that pain is worse after work at night. No symptoms reported in his hands.    Currently in Pain? Yes    Pain Score 4     Pain Location Neck    Pain Orientation Right    Pain Descriptors / Indicators Aching    Pain Type Acute pain                [facility] PT Assessment - 04/09/21 0001       Assessment   Medical Diagnosis neck pain    Referring Provider (PT) Joni Fears MD    Prior Therapy no      Balance Screen   Has the patient fallen in the past 6 months Yes    How many times? 1   due to medication   Has the patient had a decrease in activity level  because of a fear of falling?  No    Is the patient reluctant to leave their home because of a fear of falling?  No      Prior Function   Level of Independence Independent    Vocation Full time employment    Mudlogger   Overall Cognitive Status Within Functional Limits for tasks assessed      Observation/Other Assessments   Focus on Therapeutic Outcomes (FOTO)  37.45% function      ROM / Strength   AROM / PROM / Strength AROM;Strength      AROM   AROM Assessment Site Cervical;Jaw;Shoulder    Right/Left Shoulder Left;Right    Right Shoulder Flexion 125 Degrees   pain on right UT   Right Shoulder ABduction 130 Degrees   pain on right UT   Right Shoulder Internal Rotation --   pain on right UT, reaches to  S1 SP   Right Shoulder External Rotation --   reaches to base of head   Left Shoulder Flexion 155 Degrees   pain on right UT   Left Shoulder  ABduction 170 Degrees   pain on right UT   Left Shoulder Internal Rotation --   reaches to L1 SP   Left Shoulder External Rotation --   reaches to T4 SP   Cervical Flexion 20   pain on right UT   Cervical Extension 12   pain on right UT   Cervical - Right Side Bend 15   pain on right UT   Cervical - Left Side Bend 10   pain on right UT   Cervical - Right Rotation 32   pain on right UT   Cervical - Left Rotation 30   pain on right UT     Palpation   Spinal mobility hypomobility throughout c spine- pain along right    Palpation comment tenderness to palpation along right upper trap and periscapular border bilateral      Special Tests    Special Tests Cervical    Cervical Tests Spurling's;Dictraction      Spurling's   Findings Negative      Distraction Test   Findngs Negative                        Objective measurements completed on examination: See above findings.       [facility] Adult PT Treatment/Exercise - 04/09/21 0001       Exercises   Exercises Neck      Neck Exercises: Supine   Neck Retraction 10 reps;5 secs                     PT Education - 04/09/21 0909     Education Details on current presentation. HEP and POC, on findings.    Person(s) Educated Patient    Methods Explanation    Comprehension Verbalized understanding              PT Short Term Goals - 04/09/21 0908       PT SHORT TERM GOAL #1   Title Patient will report at least 50% improvement in overall symptoms and/or function to demonstrate improved functional mobility    Time 2    Period Weeks    Status New    Target Date 04/23/21      PT SHORT TERM GOAL #2   Title Patient will be independent in self management strategies  to improve quality of life and functional outcomes.    Time 2    Period Weeks    Status New    Target Date 04/23/21               PT Long Term Goals - 04/09/21 0908       PT LONG TERM GOAL #1   Title Patient will report at least  50% improvement in overall symptoms and/or function to demonstrate improved functional mobility    Time 4    Period Weeks    Status New    Target Date 05/07/21      PT LONG TERM GOAL #2   Title Patient will improve in cervical ROT and flexion andextension by at least 10 degrees to demonstrate improved overall neck ROM    Time 4    Period Weeks    Status New    Target Date 05/07/21      PT LONG TERM GOAL #3   Title Patient will meet predicted FOTO score to demonstrate improved overall function.    Time 4    Period Weeks    Status New    Target Date 05/07/21                    Plan - 04/09/21 0907     Clinical Impression Statement Patient is a 49 y.o. male who presents to physical therapy with complaint of neck pain that started 3 months ago and is localized in right UT region. Patient demonstrates decreased strength, ROM restriction, reduced flexibility, increased tenderness to palpation and postural abnormalities which are likely contributing to symptoms of pain and are negatively impacting patient ability to perform ADLs. Patient will benefit from skilled physical therapy services to address these deficits to reduce pain and improve level of function with ADLs    Examination-Activity Limitations Lift;Dressing;Hygiene/Grooming    Examination-Participation Restrictions Occupation;Meal Prep;Yard Work;Driving;Community Activity    Stability/Clinical Decision Making Stable/Uncomplicated    Clinical Decision Making Low    Rehab Potential Good    PT Frequency 2x / week    PT Duration 4 weeks    PT Treatment/Interventions ADLs/Self Care Home Management;Aquatic Therapy;Canalith Repostioning;Cryotherapy;Electrical Stimulation;Moist Heat;Traction;Therapeutic exercise;Therapeutic activities;Neuromuscular re-education;Patient/family education;Manual techniques;Dry needling;Joint Manipulations;Spinal Manipulations    PT Next Visit Plan neck ROM, manual as indicated, posture, UE ROM     PT Home Exercise Plan chin tucks    Consulted and Agree with Plan of Care Patient;Family member/caregiver    Family Member Consulted wife lisa             Patient will benefit from skilled therapeutic intervention in order to improve the following deficits and impairments:     Visit Diagnosis: Cervicalgia  Muscle weakness (generalized)  Stiffness of right shoulder, not elsewhere classified     Problem List Patient Active Problem List   Diagnosis Date Noted   Low back pain 12/30/2020   Neck pain 12/30/2020   Bradycardia 01/01/2013   Nicotine addiction 12/31/2012   Syncope 12/31/2012   9:13 AM, 04/09/21 Jerene Pitch, DPT Physical Therapy with [facility]  [phone] office   [place], [place], [address] Phone: [phone]   Fax:  [phone]  Name: Jacob Richardson MRN: [id number] Date of Birth: [birth year]

## 2021-04-16 ENCOUNTER — Other Ambulatory Visit: Payer: Self-pay

## 2021-04-16 ENCOUNTER — Encounter (HOSPITAL_COMMUNITY): Payer: Self-pay

## 2021-04-16 ENCOUNTER — Ambulatory Visit (HOSPITAL_COMMUNITY): Payer: 59

## 2021-04-16 DIAGNOSIS — M25611 Stiffness of right shoulder, not elsewhere classified: Secondary | ICD-10-CM

## 2021-04-16 DIAGNOSIS — M542 Cervicalgia: Secondary | ICD-10-CM

## 2021-04-16 DIAGNOSIS — M6281 Muscle weakness (generalized): Secondary | ICD-10-CM

## 2021-04-16 NOTE — Therapy (Signed)
[place] [facility] [address] [place], [place], [address] Phone: [phone]   Fax:  [phone]  Physical Therapy Treatment  Patient Details  Name: Jacob Richardson MRN: [id number] Date of Birth: 11/19/1970 Referring Provider (PT): Norlene Campbell MD   Encounter Date: 04/16/2021   PT End of Session - 04/16/21 0830     Visit Number 2    Number of Visits 8    Date for PT Re-Evaluation 05/07/21    Authorization Type [employer] VL 30 PT/OT/Chiro 0 used no auth    Authorization - Visit Number 2    Authorization - Number of Visits 30    PT Start Time 0825    PT Stop Time 0908    PT Time Calculation (min) 43 min    Activity Tolerance Patient tolerated treatment well    Behavior During Therapy [facility] for tasks assessed/performed             Past Medical History:  Diagnosis Date   Dysrhythmia 12/30/2012   bradycardia     Syncope and collapse 12/30/2012    Past Surgical History:  Procedure Laterality Date   ELBOW SURGERY      There were no vitals filed for this visit.   Subjective Assessment - 04/16/21 0827     Subjective Pt reports constant soreness on Rt side of neck worse when looks right, up and down, no pain to Lt.  Pain is worse wihtout medicaiton.  Has began the HEP without questions.    Currently in Pain? Yes    Pain Score 3     Pain Location Neck    Pain Orientation Right    Pain Descriptors / Indicators Sore;Tightness;Aching    Pain Type Acute pain    Pain Onset More than a month ago    Pain Frequency Constant    Aggravating Factors  looking up, down and right    Pain Relieving Factors pain med, MHP    Effect of Pain on Daily Activities pushing through it                               [facility] Adult PT Treatment/Exercise - 04/16/21 0001       Exercises   Exercises Neck      Neck Exercises: Seated   Neck Retraction 10 reps;5 secs    W Back 10 reps    Postural Training Educated importance of  posture for pain control    Other Seated Exercise 3D cervical excursion    Other Seated Exercise thoracic extension with arms crossed on chest      Neck Exercises: Supine   Neck Retraction 5 reps;5 secs      Manual Therapy   Manual Therapy Soft tissue mobilization    Manual therapy comments Manual complete separate than rest of tx    Soft tissue mobilization Supine position with LE elevated STM focus on Rt UT, PROM rotation and posterior cervical mm, suboccipital lobe 2x 30"                     PT Education - 04/16/21 0834     Education Details Reviewed goals, educated importance of HEP compliance for maximal benefits, reviewed proper form and mechanics with current HEP    Person(s) Educated Patient    Methods Explanation;Demonstration;Tactile cues    Comprehension Verbalized understanding;Returned demonstration;Need further instruction  PT Short Term Goals - 04/09/21 0908       PT SHORT TERM GOAL #1   Title Patient will report at least 50% improvement in overall symptoms and/or function to demonstrate improved functional mobility    Time 2    Period Weeks    Status New    Target Date 04/23/21      PT SHORT TERM GOAL #2   Title Patient will be independent in self management strategies to improve quality of life and functional outcomes.    Time 2    Period Weeks    Status New    Target Date 04/23/21               PT Long Term Goals - 04/09/21 0908       PT LONG TERM GOAL #1   Title Patient will report at least 50% improvement in overall symptoms and/or function to demonstrate improved functional mobility    Time 4    Period Weeks    Status New    Target Date 05/07/21      PT LONG TERM GOAL #2   Title Patient will improve in cervical ROT and flexion andextension by at least 10 degrees to demonstrate improved overall neck ROM    Time 4    Period Weeks    Status New    Target Date 05/07/21      PT LONG TERM GOAL #3   Title  Patient will meet predicted FOTO score to demonstrate improved overall function.    Time 4    Period Weeks    Status New    Target Date 05/07/21                   Plan - 04/16/21 0835     Clinical Impression Statement Reviewed goals, educated importance of HEP compliance for maximal benefits, reviewed proper form and mechanics with current HEP.  Pt educated on importance of proper posture to reduce strain of cervical musculature.  Session focus on cervical mobility and postural strengthening in pain free range.  Reports of cramping resolved following 3D cervical excursion, added to HEP.  EOS with manual STM to address moderate spasm Rt UT, reports of pain reduced and improved cervical mobility following manual.    Examination-Activity Limitations Lift;Dressing;Hygiene/Grooming    Examination-Participation Restrictions Occupation;Meal Prep;Yard Work;Driving;Community Activity    Stability/Clinical Decision Making Stable/Uncomplicated    Clinical Decision Making Low    Rehab Potential Good    PT Frequency 2x / week    PT Duration 4 weeks    PT Treatment/Interventions ADLs/Self Care Home Management;Aquatic Therapy;Canalith Repostioning;Cryotherapy;Electrical Stimulation;Moist Heat;Traction;Therapeutic exercise;Therapeutic activities;Neuromuscular re-education;Patient/family education;Manual techniques;Dry needling;Joint Manipulations;Spinal Manipulations    PT Next Visit Plan neck ROM, manual as indicated, posture, UE ROM    PT Home Exercise Plan chin tucks; 10/14: 3D cervical excursion    Consulted and Agree with Plan of Care Patient             Patient will benefit from skilled therapeutic intervention in order to improve the following deficits and impairments:     Visit Diagnosis: Cervicalgia  Muscle weakness (generalized)  Stiffness of right shoulder, not elsewhere classified     Problem List Patient Active Problem List   Diagnosis Date Noted   Low back pain  12/30/2020   Neck pain 12/30/2020   Bradycardia 01/01/2013   Nicotine addiction 12/31/2012   Syncope 12/31/2012   Becky Sax, LPTA/CLT; CBIS [phone]  Juel Burrow, PTA 04/16/2021, 9:10  AM  [facility] [address] [place], [place], [address] Phone: [phone]   Fax:  [phone]  Name: Jacob Richardson MRN: [id number] Date of Birth: 1970/09/14

## 2021-04-23 ENCOUNTER — Ambulatory Visit (HOSPITAL_COMMUNITY): Payer: 59 | Admitting: Physical Therapy

## 2021-04-27 ENCOUNTER — Telehealth (HOSPITAL_COMMUNITY): Payer: Self-pay | Admitting: Physical Therapy

## 2021-04-27 NOTE — Telephone Encounter (Signed)
PATIENT'S WIFE CALLED TO CX THESE APPTMENTS IN October  PATIENT DOESN'T WANT TO COME-HOPE THEY WILL BE BACK IN NOVEMBER.

## 2021-04-28 ENCOUNTER — Ambulatory Visit (HOSPITAL_COMMUNITY): Payer: 59 | Admitting: Physical Therapy

## 2021-04-30 ENCOUNTER — Encounter (HOSPITAL_COMMUNITY): Payer: 59

## 2021-05-04 ENCOUNTER — Ambulatory Visit (HOSPITAL_COMMUNITY): Payer: 59

## 2021-05-07 ENCOUNTER — Encounter (HOSPITAL_COMMUNITY): Payer: 59 | Admitting: Physical Therapy

## 2021-05-12 ENCOUNTER — Encounter (HOSPITAL_COMMUNITY): Payer: 59

## 2021-05-14 ENCOUNTER — Encounter (HOSPITAL_COMMUNITY): Payer: 59

## 2021-05-17 ENCOUNTER — Encounter (HOSPITAL_COMMUNITY): Payer: 59

## 2021-05-21 ENCOUNTER — Encounter (HOSPITAL_COMMUNITY): Payer: 59 | Admitting: Physical Therapy

## 2021-11-23 ENCOUNTER — Other Ambulatory Visit: Payer: Self-pay | Admitting: Internal Medicine

## 2021-11-23 ENCOUNTER — Other Ambulatory Visit (HOSPITAL_COMMUNITY): Payer: Self-pay | Admitting: Internal Medicine

## 2021-11-23 DIAGNOSIS — F17200 Nicotine dependence, unspecified, uncomplicated: Secondary | ICD-10-CM

## 2021-12-31 ENCOUNTER — Ambulatory Visit (HOSPITAL_COMMUNITY)
Admission: RE | Admit: 2021-12-31 | Discharge: 2021-12-31 | Disposition: A | Discharge status: Home / Self Care | Payer: 59 | Source: Ambulatory Visit | Attending: Internal Medicine | Admitting: Internal Medicine

## 2021-12-31 DIAGNOSIS — F17200 Nicotine dependence, unspecified, uncomplicated: Secondary | ICD-10-CM | POA: Diagnosis present

## 2022-01-16 ENCOUNTER — Encounter (HOSPITAL_BASED_OUTPATIENT_CLINIC_OR_DEPARTMENT_OTHER): Payer: Self-pay | Admitting: Emergency Medicine

## 2022-01-16 ENCOUNTER — Other Ambulatory Visit: Payer: Self-pay

## 2022-01-16 DIAGNOSIS — R1013 Epigastric pain: Secondary | ICD-10-CM | POA: Diagnosis not present

## 2022-01-16 DIAGNOSIS — R531 Weakness: Secondary | ICD-10-CM | POA: Insufficient documentation

## 2022-01-16 DIAGNOSIS — R519 Headache, unspecified: Secondary | ICD-10-CM | POA: Diagnosis not present

## 2022-01-16 DIAGNOSIS — R112 Nausea with vomiting, unspecified: Secondary | ICD-10-CM | POA: Diagnosis not present

## 2022-01-16 LAB — CBC
HCT: 45.3 % (ref 39.0–52.0)
Hemoglobin: 15.4 g/dL (ref 13.0–17.0)
MCH: 31.5 pg (ref 26.0–34.0)
MCHC: 34 g/dL (ref 30.0–36.0)
MCV: 92.6 fL (ref 80.0–100.0)
Platelets: 387 10*3/uL (ref 150–400)
RBC: 4.89 MIL/uL (ref 4.22–5.81)
RDW: 13.7 % (ref 11.5–15.5)
WBC: 10.7 10*3/uL — ABNORMAL HIGH (ref 4.0–10.5)
nRBC: 0 % (ref 0.0–0.2)

## 2022-01-16 LAB — BASIC METABOLIC PANEL
Anion gap: 11 (ref 5–15)
BUN: 9 mg/dL (ref 6–20)
CO2: 24 mmol/L (ref 22–32)
Calcium: 9.6 mg/dL (ref 8.9–10.3)
Chloride: 103 mmol/L (ref 98–111)
Creatinine, Ser: 1.11 mg/dL (ref 0.61–1.24)
GFR, Estimated: >60 mL/min (ref >60)
Glucose, Bld: 91 mg/dL (ref 70–99)
Potassium: 3.6 mmol/L (ref 3.5–5.1)
Sodium: 138 mmol/L (ref 135–145)

## 2022-01-16 NOTE — ED Triage Notes (Signed)
Pt was out in the heat a lot 2 days ago and since then has been extremly weak, unable to eat, headache and stomach cramps. Vomited tonight after trying to eat.

## 2022-01-17 ENCOUNTER — Emergency Department (HOSPITAL_BASED_OUTPATIENT_CLINIC_OR_DEPARTMENT_OTHER)
Admission: EM | Admit: 2022-01-17 | Discharge: 2022-01-17 | Disposition: A | Discharge status: Home / Self Care | Payer: 59 | Attending: Emergency Medicine | Admitting: Emergency Medicine

## 2022-01-17 DIAGNOSIS — R531 Weakness: Secondary | ICD-10-CM

## 2022-01-17 DIAGNOSIS — R1013 Epigastric pain: Secondary | ICD-10-CM

## 2022-01-17 LAB — HEPATIC FUNCTION PANEL
ALT: <5 U/L (ref 0–44)
AST: 9 U/L — ABNORMAL LOW (ref 15–41)
Albumin: 4.2 g/dL (ref 3.5–5.0)
Alkaline Phosphatase: 63 U/L (ref 38–126)
Bilirubin, Direct: 0.1 mg/dL (ref 0.0–0.2)
Indirect Bilirubin: 0.2 mg/dL — ABNORMAL LOW (ref 0.3–0.9)
Total Bilirubin: 0.3 mg/dL (ref 0.3–1.2)
Total Protein: 6.9 g/dL (ref 6.5–8.1)

## 2022-01-17 LAB — URINALYSIS, ROUTINE W REFLEX MICROSCOPIC
Bilirubin Urine: NEGATIVE
Glucose, UA: NEGATIVE mg/dL
Hgb urine dipstick: NEGATIVE
Ketones, ur: NEGATIVE mg/dL
Leukocytes,Ua: NEGATIVE
Nitrite: NEGATIVE
Protein, ur: NEGATIVE mg/dL
Specific Gravity, Urine: 1.015 (ref 1.005–1.030)
pH: 6 (ref 5.0–8.0)

## 2022-01-17 LAB — CK: Total CK: 42 U/L — ABNORMAL LOW (ref 49–397)

## 2022-01-17 LAB — LIPASE, BLOOD: Lipase: 19 U/L (ref 11–51)

## 2022-01-17 MED ORDER — SODIUM CHLORIDE 0.9 % IV BOLUS
1000.0000 mL | Freq: Once | INTRAVENOUS | Status: AC
  Administered 2022-01-17: 1000 mL via INTRAVENOUS

## 2022-01-17 MED ORDER — ACETAMINOPHEN 500 MG PO TABS
1000.0000 mg | ORAL_TABLET | Freq: Four times a day (QID) | ORAL | Status: DC | PRN
  Administered 2022-01-17: 1000 mg via ORAL
  Filled 2022-01-17: qty 2

## 2022-01-17 MED ORDER — ONDANSETRON HCL 4 MG PO TABS: 4.0000 mg | ORAL_TABLET | Freq: Three times a day (TID) | ORAL | 0 refills | Status: AC | PRN

## 2022-01-17 MED ORDER — ONDANSETRON HCL 4 MG/2ML IJ SOLN
4.0000 mg | Freq: Once | INTRAMUSCULAR | Status: AC
  Administered 2022-01-17: 4 mg via INTRAVENOUS
  Filled 2022-01-17: qty 2

## 2022-01-17 NOTE — Discharge Instructions (Signed)
You were seen today for nausea, abdominal pain, generalized weakness.  Your work-up is reassuring.  Make sure that you are staying hydrated.  Take Zofran as needed for nausea.  Avoid NSAIDs and alcohol as you may have an element of gastritis.

## 2022-01-17 NOTE — ED Notes (Signed)
This RN went to get pt from waiting room, pt's wife went to get pt form outside. Pt refused to come in, but requested a work note. This Clinical research associate informed wife that the pt would not be able to have a work note without being seen first and he has a room waiting for him to be seen in. Pt then entered the building yelling, stating "I don't need to be seen, my god damn headache is gone." Pt then stormed through the doors to the emergency department, yelling at this writer "come on so y'all can tell me there ain't shit wrong with me." Pt escorted to room, wife attempting to calm pt down. EDP made aware.

## 2022-01-17 NOTE — ED Provider Notes (Signed)
[facility] EMERGENCY DEPT Provider Note   CSN: [id number] Arrival date & time: 01/16/22  2048     History  Chief Complaint  Patient presents with   Weakness   Abdominal Pain    Jacob Richardson is a 51 y.o. male.  HPI     This is a 51 year old male who presents with nausea headache and abdominal pain.  Patient reports that he feels that he was out in the sun for too long on Friday.  He states since Friday he has had difficulty eating and has had nausea and vomiting.  He reports epigastric pain.  Denies any recent illnesses or fevers.  Denies alcohol or drug use.  He has had a headache.  No known sick contacts.  Home Medications Prior to Admission medications   Medication Sig Start Date End Date Taking? Authorizing Provider  ondansetron (ZOFRAN) 4 MG tablet Take 1 tablet (4 mg total) by mouth every 8 (eight) hours as needed for nausea or vomiting. 01/17/22  Yes Nathanyel Defenbaugh, Mayer Masker, MD  cyclobenzaprine (FLEXERIL) 10 MG tablet Take 1 tablet by mouth 3 times daily as needed for muscle spasm. Warning: May cause drowsiness. 12/15/20   Mardella Layman, MD  diclofenac (VOLTAREN) 75 MG EC tablet Take 1 tablet (75 mg total) by mouth 2 (two) times daily. 12/15/20   Mardella Layman, MD  methocarbamol (ROBAXIN) 500 MG tablet Take 1 tablet up to three times daily as needed. 01/05/21   Valeria Batman, MD      Allergies    Codeine    Review of Systems   Review of Systems  Constitutional:  Negative for fever.  Gastrointestinal:  Positive for abdominal pain, nausea and vomiting.  Neurological:  Positive for headaches.  All other systems reviewed and are negative.   Physical Exam Updated Vital Signs BP 113/73   Pulse 71   Temp 98.6 F (37 C) (Oral)   Resp 17   Ht 1.676 m (5\' 6" )   Wt 59 kg   SpO2 98%   BMI 20.98 kg/m  Physical Exam Vitals and nursing note reviewed.  Constitutional:      Appearance: He is well-developed. He is not ill-appearing.  HENT:     Head:  Normocephalic and atraumatic.  Eyes:     Pupils: Pupils are equal, round, and reactive to light.  Cardiovascular:     Rate and Rhythm: Normal rate and regular rhythm.     Heart sounds: Normal heart sounds. No murmur heard. Pulmonary:     Effort: Pulmonary effort is normal. No respiratory distress.     Breath sounds: Normal breath sounds. No wheezing.  Abdominal:     General: Bowel sounds are normal.     Palpations: Abdomen is soft.     Tenderness: There is abdominal tenderness in the epigastric area. There is no guarding or rebound.  Musculoskeletal:     Cervical back: Neck supple.  Lymphadenopathy:     Cervical: No cervical adenopathy.  Skin:    General: Skin is warm and dry.  Neurological:     Mental Status: He is alert and oriented to person, place, and time.  Psychiatric:        Mood and Affect: Mood normal.     ED Results / Procedures / Treatments   Labs (all labs ordered are listed, but only abnormal results are displayed) Labs Reviewed  CBC - Abnormal; Notable for the following components:      Result Value   WBC 10.7 (*)  All other components within normal limits  HEPATIC FUNCTION PANEL - Abnormal; Notable for the following components:   AST 9 (*)    Indirect Bilirubin 0.2 (*)    All other components within normal limits  CK - Abnormal; Notable for the following components:   Total CK 42 (*)    All other components within normal limits  BASIC METABOLIC PANEL  URINALYSIS, ROUTINE W REFLEX MICROSCOPIC  LIPASE, BLOOD    EKG EKG Interpretation  Date/Time:  Sunday January 16 2022 21:02:08 EDT Ventricular Rate:  78 PR Interval:  144 QRS Duration: 84 QT Interval:  366 QTC Calculation: 417 R Axis:   93 Text Interpretation: Normal sinus rhythm Rightward axis Borderline ECG When compared with ECG of 01-Jan-2013 06:23, No significant change was found Confirmed by Thayer Jew [phone]) on 01/16/2022 11:51:22 PM  Radiology No results  found.  Procedures Procedures    Medications Ordered in ED Medications  acetaminophen (TYLENOL) tablet 1,000 mg (1,000 mg Oral Given 01/17/22 0243)  sodium chloride 0.9 % bolus 1,000 mL (0 mLs Intravenous Stopped 01/17/22 0330)  ondansetron (ZOFRAN) injection 4 mg (4 mg Intravenous Given 01/17/22 0202)    ED Course/ Medical Decision Making/ A&P                           Medical Decision Making Amount and/or Complexity of Data Reviewed Labs: ordered.  Risk OTC drugs. Prescription drug management.   This patient presents to the ED for concern of generalized weakness, epigastric pain, nausea, this involves an extensive number of treatment options, and is a complaint that carries with it a high risk of complications and morbidity.  I considered the following differential and admission for this acute, potentially life threatening condition.  The differential diagnosis includes heat exhaustion, dehydration, reflux, gastritis, gastroenteritis, less likely obstructive pathology  MDM:    This is a 51 year old male who presents with generalized weakness and nausea.  States he has had difficulty eating.  He is nontoxic and vital signs are reassuring.  He has some slight tenderness to the epigastrium without rebound or guarding.  Patient was given fluids and nausea medication.  Labs obtained.  No significant laboratory derangements.  Normal hepatic function test.  Normal lipase.  No white count.  Does not appear objectively dehydrated.  He is not peritonitic on exam and has minimal tenderness.  Low suspicion for cholecystitis.  Potentially he has an element of gastritis.  Will discharge with Zofran for nausea.  Recommend avoiding NSAIDs and alcohol.  Recommend aggressive hydration.  (Labs, imaging, consults)  Labs: I Ordered, and personally interpreted labs.  The pertinent results include: CBC, CMP, lipase, urinalysis  Imaging Studies ordered: I ordered imaging studies including none I  independently visualized and interpreted imaging. I agree with the radiologist interpretation  Additional history obtained from wife.  External records from outside source obtained and reviewed including prior evaluations  Cardiac Monitoring: The patient was maintained on a cardiac monitor.  I personally viewed and interpreted the cardiac monitored which showed an underlying rhythm of: Normal sinus rhythm  Reevaluation: After the interventions noted above, I reevaluated the patient and found that they have :improved  Social Determinants of Health: Lives independently  Disposition: Discharge  Co morbidities that complicate the patient evaluation  Past Medical History:  Diagnosis Date   Dysrhythmia 12/30/2012   bradycardia     Syncope and collapse 12/30/2012     Medicines Meds ordered this encounter  Medications  sodium chloride 0.9 % bolus 1,000 mL   ondansetron (ZOFRAN) injection 4 mg   acetaminophen (TYLENOL) tablet 1,000 mg   ondansetron (ZOFRAN) 4 MG tablet    Sig: Take 1 tablet (4 mg total) by mouth every 8 (eight) hours as needed for nausea or vomiting.    Dispense:  20 tablet    Refill:  0    I have reviewed the patients home medicines and have made adjustments as needed  Problem List / ED Course: Problem List Items Addressed This Visit   None Visit Diagnoses     Generalized weakness    -  Primary   Epigastric pain                       Final Clinical Impression(s) / ED Diagnoses Final diagnoses:  Generalized weakness  Epigastric pain    Rx / DC Orders ED Discharge Orders          Ordered    ondansetron (ZOFRAN) 4 MG tablet  Every 8 hours PRN        01/17/22 0350              Shon Baton, MD 01/17/22 [phone]

## 2022-10-10 DIAGNOSIS — H61002 Unspecified perichondritis of left external ear: Secondary | ICD-10-CM | POA: Diagnosis not present

## 2022-11-14 DIAGNOSIS — Z0001 Encounter for general adult medical examination with abnormal findings: Secondary | ICD-10-CM | POA: Diagnosis not present

## 2022-11-14 DIAGNOSIS — F17210 Nicotine dependence, cigarettes, uncomplicated: Secondary | ICD-10-CM | POA: Diagnosis not present

## 2022-11-14 DIAGNOSIS — E785 Hyperlipidemia, unspecified: Secondary | ICD-10-CM | POA: Diagnosis not present

## 2022-11-21 DIAGNOSIS — J439 Emphysema, unspecified: Secondary | ICD-10-CM | POA: Diagnosis not present

## 2022-11-21 DIAGNOSIS — Z0001 Encounter for general adult medical examination with abnormal findings: Secondary | ICD-10-CM | POA: Diagnosis not present

## 2022-11-21 DIAGNOSIS — I700 Atherosclerosis of aorta: Secondary | ICD-10-CM | POA: Diagnosis not present

## 2022-11-21 DIAGNOSIS — E785 Hyperlipidemia, unspecified: Secondary | ICD-10-CM | POA: Diagnosis not present

## 2022-12-21 ENCOUNTER — Other Ambulatory Visit (HOSPITAL_COMMUNITY): Payer: Self-pay

## 2022-12-22 ENCOUNTER — Other Ambulatory Visit (HOSPITAL_COMMUNITY): Payer: Self-pay | Admitting: Internal Medicine

## 2022-12-22 ENCOUNTER — Other Ambulatory Visit (HOSPITAL_COMMUNITY): Payer: Self-pay

## 2022-12-22 DIAGNOSIS — Z122 Encounter for screening for malignant neoplasm of respiratory organs: Secondary | ICD-10-CM

## 2022-12-22 DIAGNOSIS — F17200 Nicotine dependence, unspecified, uncomplicated: Secondary | ICD-10-CM

## 2022-12-22 MED ORDER — ROSUVASTATIN CALCIUM 20 MG PO TABS
20.0000 mg | ORAL_TABLET | Freq: Every evening | ORAL | 4 refills | Status: AC
  Filled 2022-12-22: qty 90, 90d supply, fill #0
  Filled 2023-04-21: qty 90, 90d supply, fill #1
  Filled 2023-07-31: qty 90, 90d supply, fill #2

## 2022-12-26 ENCOUNTER — Other Ambulatory Visit (HOSPITAL_COMMUNITY): Payer: Self-pay

## 2023-01-16 DIAGNOSIS — I700 Atherosclerosis of aorta: Secondary | ICD-10-CM | POA: Diagnosis not present

## 2023-01-16 DIAGNOSIS — E785 Hyperlipidemia, unspecified: Secondary | ICD-10-CM | POA: Diagnosis not present

## 2023-01-16 DIAGNOSIS — J439 Emphysema, unspecified: Secondary | ICD-10-CM | POA: Diagnosis not present

## 2023-01-23 DIAGNOSIS — E785 Hyperlipidemia, unspecified: Secondary | ICD-10-CM | POA: Diagnosis not present

## 2023-01-23 DIAGNOSIS — I700 Atherosclerosis of aorta: Secondary | ICD-10-CM | POA: Diagnosis not present

## 2023-02-06 ENCOUNTER — Other Ambulatory Visit (HOSPITAL_COMMUNITY): Payer: Self-pay

## 2023-02-07 ENCOUNTER — Ambulatory Visit (HOSPITAL_COMMUNITY): Payer: 59

## 2023-02-07 ENCOUNTER — Encounter (HOSPITAL_COMMUNITY): Payer: Self-pay

## 2023-04-21 ENCOUNTER — Other Ambulatory Visit (HOSPITAL_COMMUNITY): Payer: Self-pay

## 2023-07-31 ENCOUNTER — Other Ambulatory Visit (HOSPITAL_COMMUNITY): Payer: Self-pay
